# Patient Record
Sex: Male | Born: 1940
Health system: Southern US, Community
[De-identification: ages and names within clinical notes are randomized; demographics above are authoritative.]

## PROBLEM LIST (undated history)

## (undated) DIAGNOSIS — C4490 Unspecified malignant neoplasm of skin, unspecified: Secondary | ICD-10-CM

## (undated) DIAGNOSIS — A048 Other specified bacterial intestinal infections: Secondary | ICD-10-CM

## (undated) DIAGNOSIS — N529 Male erectile dysfunction, unspecified: Secondary | ICD-10-CM

## (undated) DIAGNOSIS — C61 Malignant neoplasm of prostate: Secondary | ICD-10-CM

## (undated) DIAGNOSIS — C439 Malignant melanoma of skin, unspecified: Secondary | ICD-10-CM

## (undated) DIAGNOSIS — F329 Major depressive disorder, single episode, unspecified: Secondary | ICD-10-CM

## (undated) DIAGNOSIS — R918 Other nonspecific abnormal finding of lung field: Secondary | ICD-10-CM

## (undated) DIAGNOSIS — K2270 Barrett's esophagus without dysplasia: Secondary | ICD-10-CM

## (undated) DIAGNOSIS — I10 Essential (primary) hypertension: Secondary | ICD-10-CM

## (undated) DIAGNOSIS — K635 Polyp of colon: Secondary | ICD-10-CM

## (undated) DIAGNOSIS — R1310 Dysphagia, unspecified: Secondary | ICD-10-CM

## (undated) DIAGNOSIS — F419 Anxiety disorder, unspecified: Secondary | ICD-10-CM

## (undated) DIAGNOSIS — F32A Depression, unspecified: Secondary | ICD-10-CM

## (undated) HISTORY — DX: Dysphagia, unspecified: R13.10

## (undated) HISTORY — DX: Malignant melanoma of skin, unspecified: C43.9

## (undated) HISTORY — DX: Depression, unspecified: F32.A

## (undated) HISTORY — DX: Other specified bacterial intestinal infections: A04.8

## (undated) HISTORY — DX: Other nonspecific abnormal finding of lung field: R91.8

## (undated) HISTORY — DX: Male erectile dysfunction, unspecified: N52.9

## (undated) HISTORY — PX: KNEE SURGERY: SHX244

## (undated) HISTORY — DX: Polyp of colon: K63.5

## (undated) HISTORY — DX: Barrett's esophagus without dysplasia: K22.70

## (undated) HISTORY — DX: Major depressive disorder, single episode, unspecified: F32.9

---

## 1995-09-24 HISTORY — PX: INSERTION PROSTATE RADIATION SEED: SUR718

## 2002-04-16 ENCOUNTER — Ambulatory Visit (HOSPITAL_COMMUNITY): Admission: RE | Admit: 2002-04-16 | Discharge: 2002-04-16 | Payer: Self-pay | Admitting: Gastroenterology

## 2002-11-25 ENCOUNTER — Encounter (INDEPENDENT_AMBULATORY_CARE_PROVIDER_SITE_OTHER): Payer: Self-pay | Admitting: Specialist

## 2002-11-25 ENCOUNTER — Ambulatory Visit (HOSPITAL_BASED_OUTPATIENT_CLINIC_OR_DEPARTMENT_OTHER): Admission: RE | Admit: 2002-11-25 | Discharge: 2002-11-25 | Payer: Self-pay | Admitting: Plastic Surgery

## 2004-03-11 ENCOUNTER — Emergency Department (HOSPITAL_COMMUNITY): Admission: EM | Admit: 2004-03-11 | Discharge: 2004-03-11 | Payer: Self-pay | Admitting: Emergency Medicine

## 2005-06-06 ENCOUNTER — Ambulatory Visit (HOSPITAL_BASED_OUTPATIENT_CLINIC_OR_DEPARTMENT_OTHER): Admission: RE | Admit: 2005-06-06 | Discharge: 2005-06-06 | Payer: Self-pay | Admitting: Plastic Surgery

## 2005-06-06 ENCOUNTER — Encounter (INDEPENDENT_AMBULATORY_CARE_PROVIDER_SITE_OTHER): Payer: Self-pay | Admitting: Specialist

## 2008-02-04 ENCOUNTER — Ambulatory Visit (HOSPITAL_COMMUNITY): Admission: RE | Admit: 2008-02-04 | Discharge: 2008-02-04 | Payer: Self-pay | Admitting: Gastroenterology

## 2008-12-07 ENCOUNTER — Emergency Department (HOSPITAL_COMMUNITY): Admission: EM | Admit: 2008-12-07 | Discharge: 2008-12-07 | Payer: Self-pay | Admitting: Emergency Medicine

## 2009-05-15 IMAGING — CR DG KNEE COMPLETE 4+V*R*
4 series · 4 of 4 positions shown · non-contrast
Comparison: None

CLINICAL DATA: Anterior knee pain status post fall yesterday.

RIGHT KNEE - COMPLETE 4+ VIEW

[view not recorded (1 of 4)]
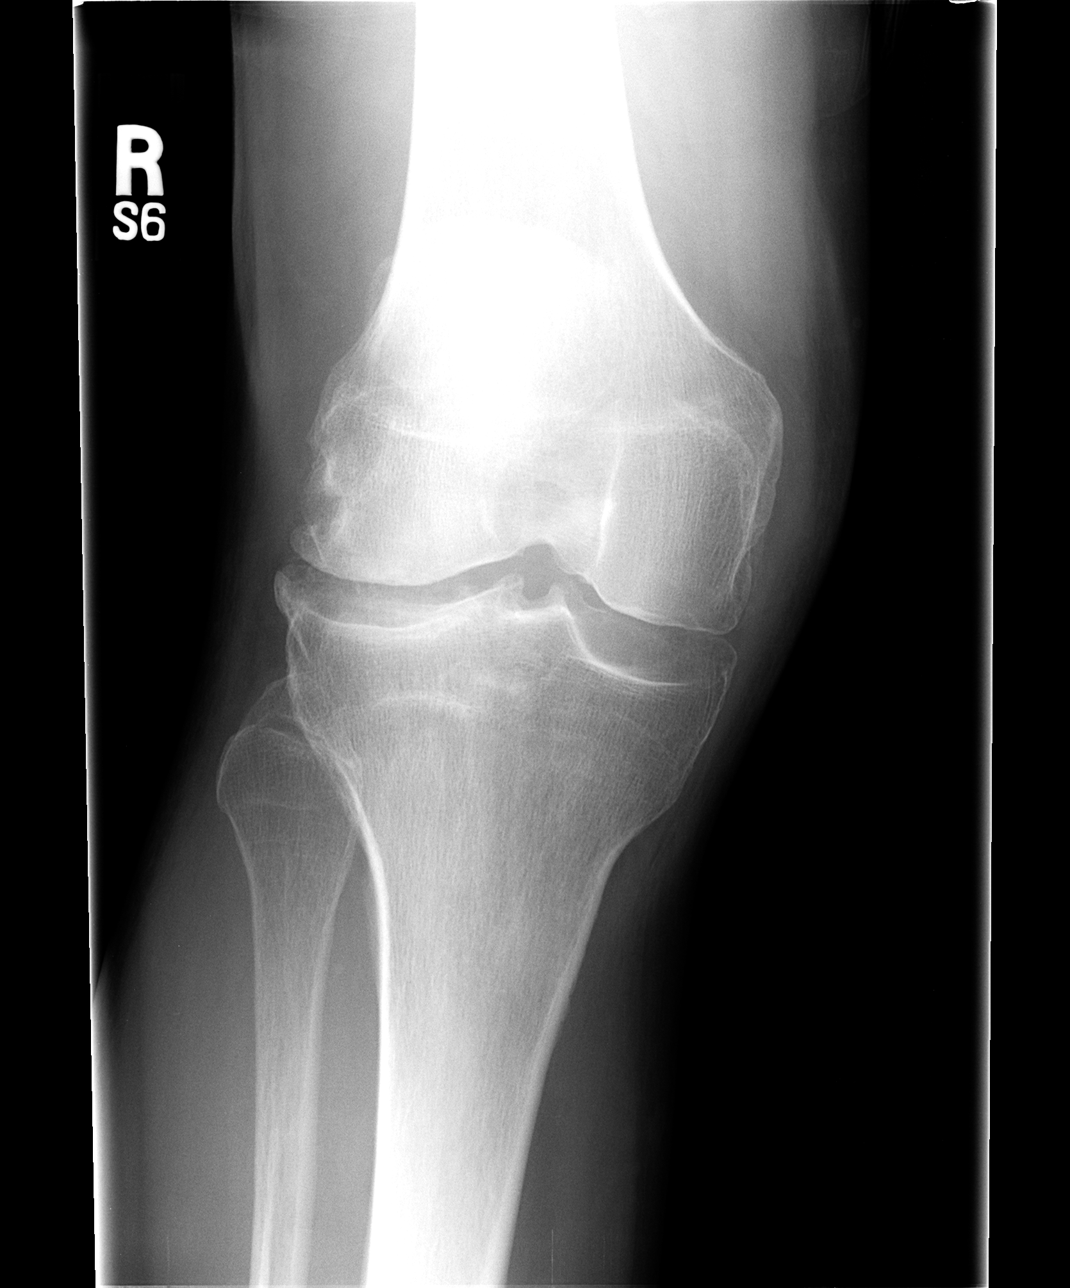

[view not recorded (2 of 4)]
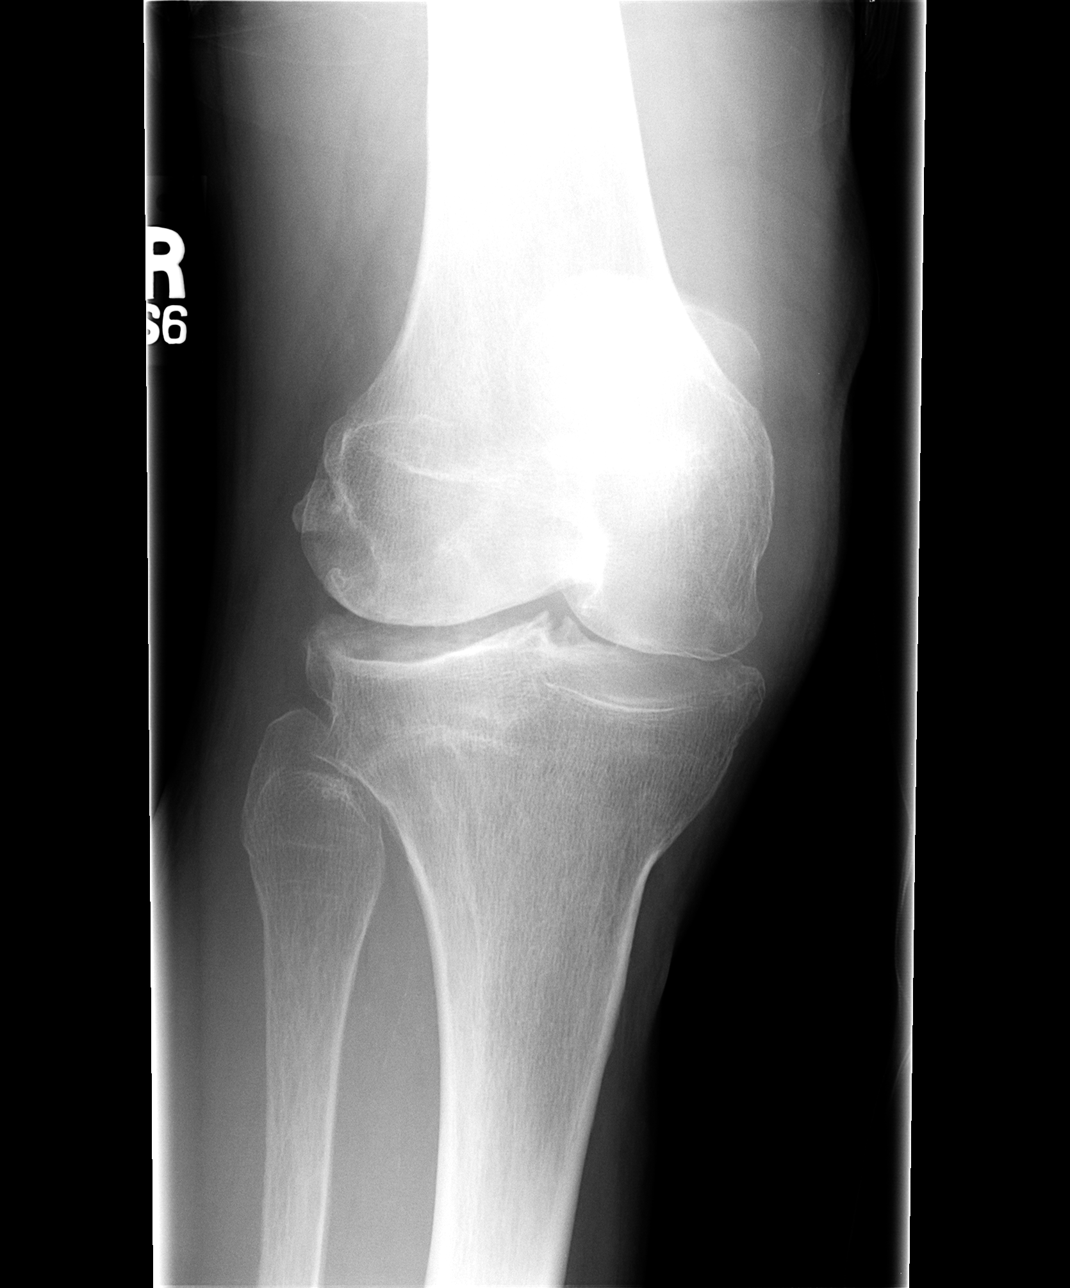

[view not recorded (3 of 4)]
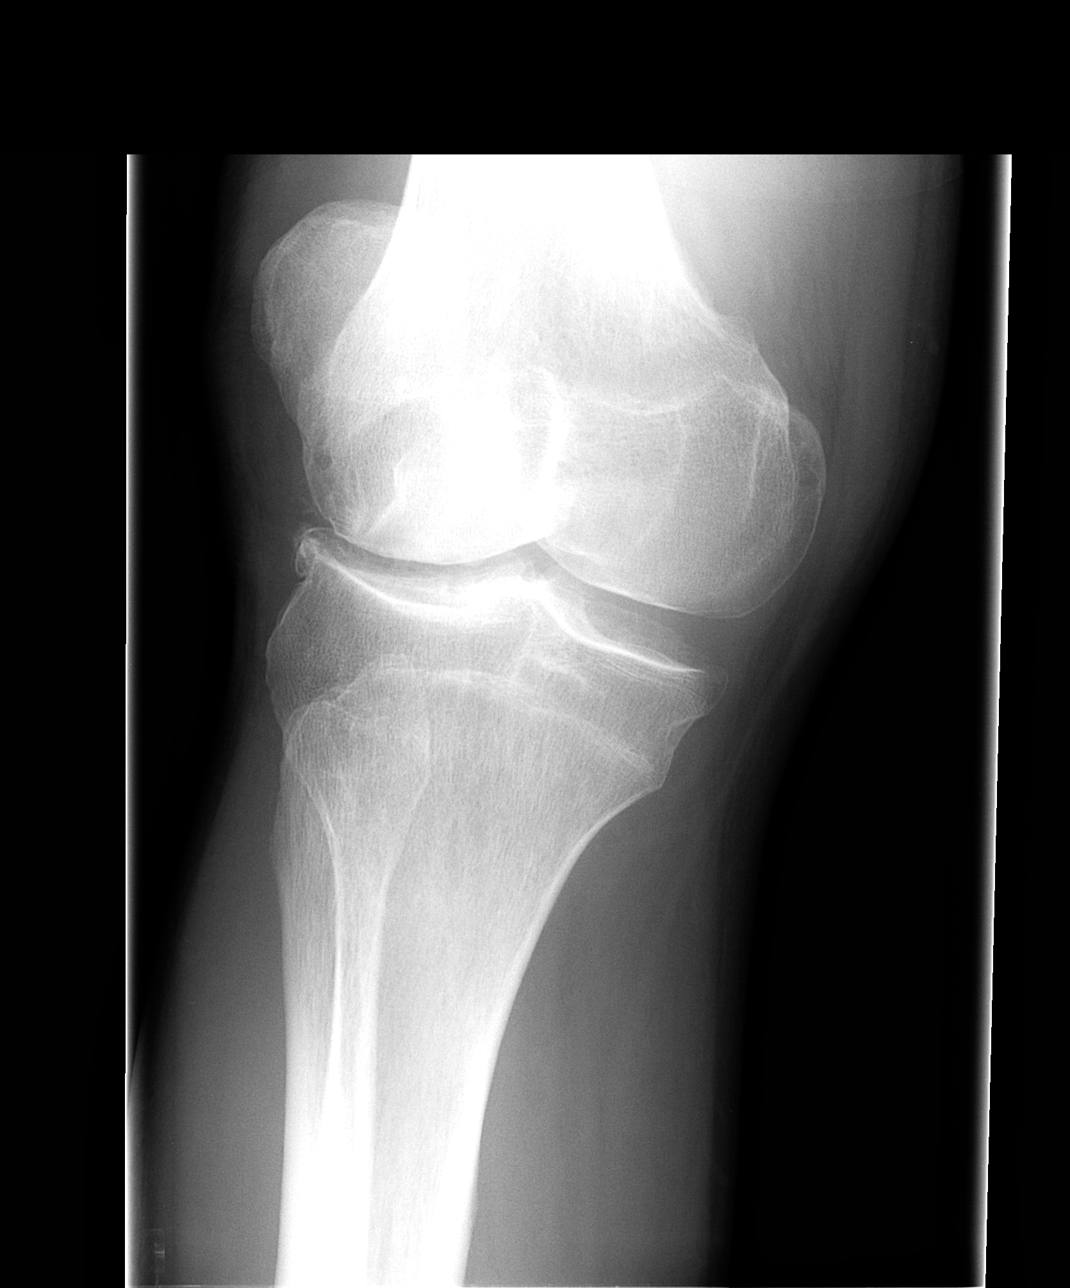

[view not recorded (4 of 4)]
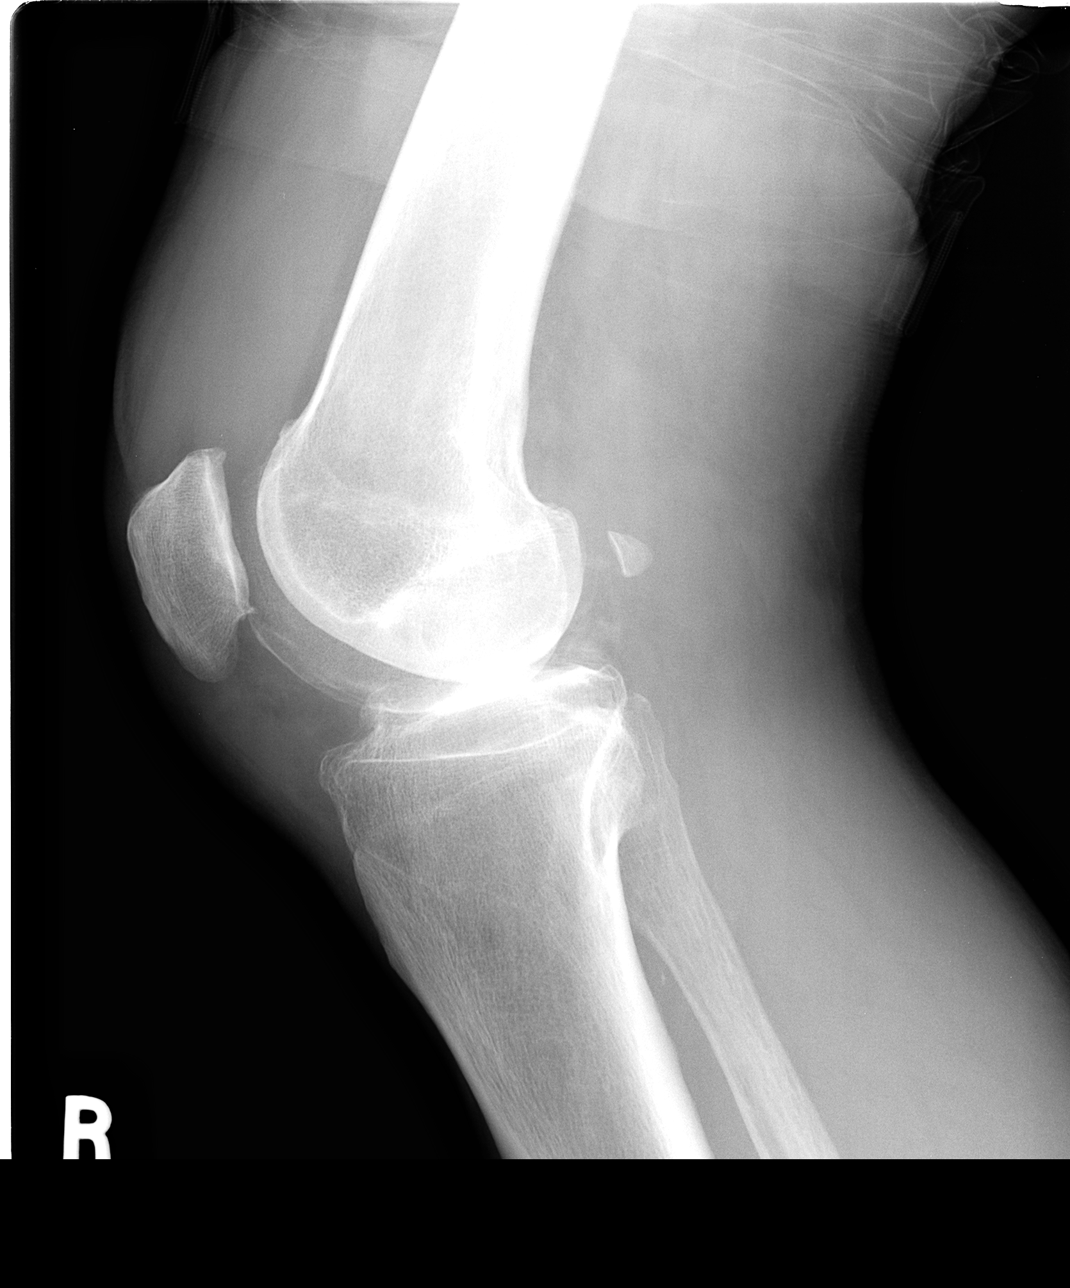

[4 of 4 positions shown; findings below may reference images not displayed]

FINDINGS: There is a large knee joint effusion with poor definition
of Hoffa's fat.  No acute fracture, dislocation or definite loose
body is identified.  There are tricompartmental degenerative
changes, most advanced laterally.
IMPRESSION: Large knee joint effusion and tricompartmental degenerative
changes.  No demonstrated acute osseous findings.

## 2011-02-05 NOTE — Op Note (Signed)
Douglas Fritz, Douglas Fritz                 ACCOUNT NO.:  [id number]   MEDICAL RECORD NO.:  [id number]          PATIENT TYPE:  AMB   LOCATION:  ENDO                         FACILITY:  [facility]   PHYSICIAN:  James L. Malon Kindle., M.D.DATE OF BIRTH:  03-26-1941   DATE OF PROCEDURE:  02/04/2008  DATE OF DISCHARGE:                               OPERATIVE REPORT   PROCEDURE:  Colonoscopy.   ENDOSCOPIST:  Llana Aliment. Edwards, M.D.   MEDICATIONS:  Fentanyl 75 mcg, Versed 7.5 mg IV.   SCOPE:  Pentax pediatric colonoscope.   INDICATIONS:  Previous history of adenomatous colon polyps.  This is  done as a routine followup.   DESCRIPTION OF PROCEDURE:  Procedure was explained to the patient and  consent obtained.  In the left lateral decubitus position, the pediatric  Pentax scope was inserted and advanced.  Prep was excellent.  Cecum was  reached without difficulty.  Ileocecal valve and appendiceal orifice  were seen.  Scope was withdrawn and the colon examined upon withdrawal.  Throughout the colon there was no significant diverticular disease, no  polyps, tumors or any other gross lesions.  The scope was withdrawn down  in the rectum and in the retroflex view, the patient was seen to have  very large internal hemorrhoids.  No other abnormalities were seen.   ASSESSMENT:  1. Large internal hemorrhoids.  2. History of colon polyps with a negative colon at this time.   PLAN:  We will recommend 5-year repeat colonoscopy.           ______________________________  Llana Aliment. Malon Kindle., M.D.     Waldron Session  D:  02/04/2008  T:  02/04/2008  Job:  [id number]   cc:   Oley Balm. Georgina Pillion, M.D.  Fax: [phone]

## 2011-02-08 NOTE — Procedures (Signed)
[place]  Patient:    Douglas Fritz, Douglas Fritz Visit Number: [id number] MRN: [id number]          Service Type: END Location: ENDO Attending Physician:  Orland Mustard Dictated by:   Llana Aliment. Randa Evens, M.D. Proc. Date: 04/16/02 Admit Date:  04/16/2002   CC:         Oley Balm. Georgina Pillion, M.D.   Procedure Report  PROCEDURE:  Colonoscopy.  MEDICATIONS:  Fentanyl 75 mcg, Versed 8 mg IV.  SCOPE:  Olympus pediatric video colonoscope.  INDICATIONS:  Previous history of adenomatous colon polyp. This patient was due for a three-year followup, but did keep that appointment; we are doing it at five years.  DESCRIPTION OF PROCEDURE:  The procedure had been explained to the patient; consent obtained.  With the patient in the left lateral decubitus position, the Olympus pediatric video colonoscope inserted, advanced under direct visualization. Prep excellent. We were able to reach the cecum without difficulty. The ileocecal valve and appendiceal orifice were seen. Scope withdrawn. Cecum, ascending colon, hepatic flexure, transverse colon, splenic flexure, descending, and sigmoid colon were seen well and were unremarkable. No polyps were seen throughout. The rectum was free of polyps. The scope withdrawn; patient tolerated the procedure well.  ASSESSMENT: 1. No evidence of further polyps. 2. Plan recommend repeating in five years. 3. Routine yearly Hemoccult during his physical exam. Dictated by:   Llana Aliment. Randa Evens, M.D. Attending Physician:  Orland Mustard DD:  04/16/02 TD:  04/19/02 Job: [id number]

## 2011-02-08 NOTE — Op Note (Signed)
   NAME:  Douglas Fritz, Douglas Fritz                             ACCOUNT NO.:  [id number]   MEDICAL RECORD NO.:  [id number]                   PATIENT TYPE:  AMB   LOCATION:  DSC                                  FACILITY:  [facility]   PHYSICIAN:  Consuello Bossier., M.D.         DATE OF BIRTH:  [birth year]   DATE OF PROCEDURE:  DATE OF DISCHARGE:                                 OPERATIVE REPORT   PREOPERATIVE DIAGNOSES:  Closure of complex wound of right upper back  following wide removal of large basal cell carcinoma, closure of complex  wound right dorsal forearm following wide local excision of basal cell  carcinoma and closure of right medial calf lesion following wide local  excision of basal cell carcinoma.   POSTOPERATIVE DIAGNOSES:  Closure of complex wound of right upper back  following wide removal of large basal cell carcinoma, closure of complex  wound right dorsal forearm following wide local excision of basal cell  carcinoma and closure of right medial calf lesion following wide local  excision of basal cell carcinoma.   SURGEON:  Pleas Patricia, M.D.   ANESTHESIA:  Xylocaine 2% with epinephrine 1:100,000.   FINDINGS:  The patient had large lesions, the one on the right posterior  shoulder area measuring just over 3 cm with resulting 5 1/2 cm complex wound  closure and the one on the right forearm lesion measuring 1 1/2 cm with a 3  cm complex closure and the one on the medial calf also 1 1/2 cm with a 3 cm  complex wound closure.   DESCRIPTION OF PROCEDURE:  The patient was brought to the operating room,  marked off for the planned repair following excision. The excision was  placed as much as possible in the wrinkle lines on the right back when not  being allowed to do this because of the shape of the lesion. He was prepped  with Betadine and draped sterilely. He was anesthetized with Xylocaine 2%  with epinephrine 1:100,000. Following the onset of anesthesia, an elliptical  excisional biopsies were performed. Bleeding was controlled with  electrocautery and there was noted to be good hemostasis. The wounds were  closed with interrupted subcutaneous 3-0 Monocryl followed by a running 4-0  Prolene obtaining the above length complex wound closures. Light compressive  dressings were applied. The patient tolerated the procedure well and was  discharged from the operating room subsequently to be followed by me as an  outpatient.                                               Consuello Bossier., M.D.    Tery Sanfilippo  D:  11/26/2002  T:  11/27/2002  Job:  [id number]

## 2011-07-22 ENCOUNTER — Other Ambulatory Visit: Payer: Self-pay | Admitting: Dermatology

## 2011-11-28 ENCOUNTER — Other Ambulatory Visit: Payer: Self-pay | Admitting: Dermatology

## 2012-06-04 ENCOUNTER — Other Ambulatory Visit: Payer: Self-pay | Admitting: Dermatology

## 2012-11-21 HISTORY — PX: MELANOMA EXCISION: SHX5266

## 2012-12-10 ENCOUNTER — Other Ambulatory Visit: Payer: Self-pay | Admitting: Dermatology

## 2012-12-16 ENCOUNTER — Other Ambulatory Visit: Payer: Self-pay | Admitting: Dermatology

## 2013-01-25 ENCOUNTER — Encounter (HOSPITAL_COMMUNITY): Payer: Self-pay | Admitting: Emergency Medicine

## 2013-01-25 ENCOUNTER — Emergency Department (HOSPITAL_COMMUNITY)
Admission: EM | Admit: 2013-01-25 | Discharge: 2013-01-25 | Disposition: A | Discharge status: Home / Self Care | Payer: Medicare Other | Attending: Emergency Medicine | Admitting: Emergency Medicine

## 2013-01-25 DIAGNOSIS — T63461A Toxic effect of venom of wasps, accidental (unintentional), initial encounter: Secondary | ICD-10-CM | POA: Insufficient documentation

## 2013-01-25 DIAGNOSIS — Z79899 Other long term (current) drug therapy: Secondary | ICD-10-CM | POA: Insufficient documentation

## 2013-01-25 DIAGNOSIS — Y939 Activity, unspecified: Secondary | ICD-10-CM | POA: Insufficient documentation

## 2013-01-25 DIAGNOSIS — I10 Essential (primary) hypertension: Secondary | ICD-10-CM | POA: Insufficient documentation

## 2013-01-25 DIAGNOSIS — T6391XA Toxic effect of contact with unspecified venomous animal, accidental (unintentional), initial encounter: Secondary | ICD-10-CM | POA: Insufficient documentation

## 2013-01-25 DIAGNOSIS — Z8546 Personal history of malignant neoplasm of prostate: Secondary | ICD-10-CM | POA: Insufficient documentation

## 2013-01-25 DIAGNOSIS — Y929 Unspecified place or not applicable: Secondary | ICD-10-CM | POA: Insufficient documentation

## 2013-01-25 DIAGNOSIS — Z7982 Long term (current) use of aspirin: Secondary | ICD-10-CM | POA: Insufficient documentation

## 2013-01-25 DIAGNOSIS — Z85828 Personal history of other malignant neoplasm of skin: Secondary | ICD-10-CM | POA: Insufficient documentation

## 2013-01-25 HISTORY — DX: Essential (primary) hypertension: I10

## 2013-01-25 HISTORY — DX: Unspecified malignant neoplasm of skin, unspecified: C44.90

## 2013-01-25 HISTORY — DX: Malignant neoplasm of prostate: C61

## 2013-01-25 MED ORDER — DIPHENHYDRAMINE HCL 50 MG/ML IJ SOLN
12.5000 mg | Freq: Once | INTRAMUSCULAR | Status: AC
  Administered 2013-01-25: 12.5 mg via INTRAVENOUS
  Filled 2013-01-25: qty 1

## 2013-01-25 MED ORDER — FAMOTIDINE IN NACL 20-0.9 MG/50ML-% IV SOLN
20.0000 mg | Freq: Once | INTRAVENOUS | Status: AC
  Administered 2013-01-25: 20 mg via INTRAVENOUS
  Filled 2013-01-25: qty 50

## 2013-01-25 MED ORDER — METHYLPREDNISOLONE SODIUM SUCC 125 MG IJ SOLR
125.0000 mg | Freq: Once | INTRAMUSCULAR | Status: AC
  Administered 2013-01-25: 125 mg via INTRAVENOUS
  Filled 2013-01-25: qty 2

## 2013-01-25 NOTE — ED Provider Notes (Signed)
History     CSN: [id number]  Arrival date & time 01/25/13  0625   First MD Initiated Contact with Patient 01/25/13 [phone]      Chief Complaint  Patient presents with  . Insect Bite    (Consider location/radiation/quality/duration/timing/severity/associated sxs/prior treatment) HPI Comments: He was stung by a hornet or wasp 20 minutes prior to arrival.  No rash. He came to the ED because he has had severe reaction to bee stings in the past. He reports feeling tightness in his throat but no lip or tongue swelling or difficulty breathing, but states he feels that the tightness may be "apprehension" rather than actual.  The history is provided by the patient.    Past Medical History  Diagnosis Date  . Prostate cancer   . Skin cancer   . Hypertension     Past Surgical History  Procedure Laterality Date  . Knee surgery      No family history on file.  History  Substance Use Topics  . Smoking status: Never Smoker   . Smokeless tobacco: Not on file  . Alcohol Use: 4.2 oz/week    7 Cans of beer per week      Review of Systems  Constitutional: Negative for fever.  HENT: Negative for sore throat, facial swelling and trouble swallowing.   Respiratory: Negative for choking and shortness of breath.   Cardiovascular: Negative for chest pain.  Gastrointestinal: Negative for nausea.  Skin: Negative for rash.  Neurological: Negative for light-headedness.    Allergies  Bee venom  Home Medications   Current Outpatient Rx  Name  Route  Sig  Dispense  Refill  . amLODipine (NORVASC) 5 MG tablet   Oral   Take 5 mg by mouth daily.         Marland Kitchen aspirin EC 81 MG tablet   Oral   Take 81 mg by mouth daily.           BP 156/93  Pulse 80  Temp(Src) 98.3 F (36.8 C) (Oral)  Resp 18  SpO2 99%  Physical Exam  Constitutional: He is oriented to person, place, and time. He appears well-developed and well-nourished.  HENT:  Head: Normocephalic.  Mouth/Throat: Oropharynx is  clear and moist.  Neck: Normal range of motion. Neck supple.  Cardiovascular: Normal rate and regular rhythm.   Pulmonary/Chest: Effort normal and breath sounds normal.  Abdominal: Soft. Bowel sounds are normal. There is no tenderness. There is no rebound and no guarding.  Musculoskeletal: Normal range of motion.  Neurological: He is alert and oriented to person, place, and time.  Skin: Skin is warm and dry. No rash noted.  Psychiatric: He has a normal mood and affect.    ED Course  Procedures (including critical care time)  Labs Reviewed - No data to display No results found.   No diagnosis found.  1. Wasp sting  MDM  He develops no symptoms of rash, swelling, SOB while under observation. Given IV medications, not Epi. He is feeling well and aymptomatic. Stable for discharge.  Discussed with Dr. Rulon Abide.        Arnoldo Hooker, PA-C 01/25/13 [phone]

## 2013-01-25 NOTE — ED Provider Notes (Signed)
Medical screening examination/treatment/procedure(s) were performed by non-physician practitioner and as supervising physician I was immediately available for consultation/collaboration.  John-Adam Melaysia Streed, M.D.     John-Adam Quinne Pires, MD 01/25/13 0745 

## 2013-01-25 NOTE — ED Notes (Addendum)
Pt reports he got stung by bee approx 15-20 mins ago on the finger. States last time he was stung by bee he got hives and slight swelling to throat. Pt reports he does feel some tightness in throat now. Pt is a&ox4. Denies itching or hives.

## 2013-02-09 ENCOUNTER — Encounter (HOSPITAL_COMMUNITY): Payer: Self-pay | Admitting: Emergency Medicine

## 2013-02-09 ENCOUNTER — Emergency Department (HOSPITAL_COMMUNITY): Payer: Medicare Other

## 2013-02-09 ENCOUNTER — Emergency Department (HOSPITAL_COMMUNITY)
Admission: EM | Admit: 2013-02-09 | Discharge: 2013-02-09 | Disposition: A | Discharge status: Home / Self Care | Payer: Medicare Other | Attending: Emergency Medicine | Admitting: Emergency Medicine

## 2013-02-09 DIAGNOSIS — R1013 Epigastric pain: Secondary | ICD-10-CM | POA: Insufficient documentation

## 2013-02-09 DIAGNOSIS — Z79899 Other long term (current) drug therapy: Secondary | ICD-10-CM | POA: Insufficient documentation

## 2013-02-09 DIAGNOSIS — F411 Generalized anxiety disorder: Secondary | ICD-10-CM | POA: Insufficient documentation

## 2013-02-09 DIAGNOSIS — F419 Anxiety disorder, unspecified: Secondary | ICD-10-CM

## 2013-02-09 DIAGNOSIS — Z7982 Long term (current) use of aspirin: Secondary | ICD-10-CM | POA: Insufficient documentation

## 2013-02-09 DIAGNOSIS — Z85828 Personal history of other malignant neoplasm of skin: Secondary | ICD-10-CM | POA: Insufficient documentation

## 2013-02-09 DIAGNOSIS — R6889 Other general symptoms and signs: Secondary | ICD-10-CM | POA: Insufficient documentation

## 2013-02-09 DIAGNOSIS — I10 Essential (primary) hypertension: Secondary | ICD-10-CM | POA: Insufficient documentation

## 2013-02-09 DIAGNOSIS — Z8546 Personal history of malignant neoplasm of prostate: Secondary | ICD-10-CM | POA: Insufficient documentation

## 2013-02-09 LAB — CBC
MCHC: 36.1 g/dL — ABNORMAL HIGH (ref 30.0–36.0)
MCV: 90.7 fL (ref 78.0–100.0)
WBC: 10 10*3/uL (ref 4.0–10.5)

## 2013-02-09 LAB — BASIC METABOLIC PANEL
Chloride: 95 mEq/L — ABNORMAL LOW (ref 96–112)
Creatinine, Ser: 0.83 mg/dL (ref 0.50–1.35)
GFR calc Af Amer: >90 mL/min (ref >90)
Glucose, Bld: 123 mg/dL — ABNORMAL HIGH (ref 70–99)
Potassium: 3.7 mEq/L (ref 3.5–5.1)

## 2013-02-09 LAB — POCT I-STAT TROPONIN I: Troponin i, poc: 0 ng/mL (ref 0.00–0.08)

## 2013-02-09 MED ORDER — LORAZEPAM 1 MG PO TABS: 1.0000 mg | ORAL_TABLET | Freq: Three times a day (TID) | ORAL | Status: DC | PRN

## 2013-02-09 MED ORDER — LORAZEPAM 2 MG/ML IJ SOLN
1.0000 mg | Freq: Once | INTRAMUSCULAR | Status: AC
  Administered 2013-02-09: 1 mg via INTRAVENOUS
  Filled 2013-02-09: qty 1

## 2013-02-09 NOTE — ED Provider Notes (Signed)
History     CSN: [id number]  Arrival date & time 02/09/13  1831   First MD Initiated Contact with Patient 02/09/13 1952      Chief Complaint  Patient presents with  . Chest Pain    (Consider location/radiation/quality/duration/timing/severity/associated sxs/prior treatment) Patient is a 72 y.o. male presenting with chest pain. The history is provided by the patient.  Chest Pain He noted onset this morning of a sense of choking and AF date data chest pain which seemed to start in the epigastric area and go up into his chest. He has difficulty putting a number on it. He felt like he was having difficulty swallowing. There is no associated dyspnea, nausea, diaphoresis. He he states he's been under a lot of stress recently and thinks that that may have been contributing to it. Nothing seemed to make the symptoms better nothing made his symptoms worse. Has had generally resolved at this point although he is still feeling a little anxious. Cardiac risk factors include hypertension but he denies history of diabetes, hyperlipidemia, smoking.  Past Medical History  Diagnosis Date  . Prostate cancer   . Skin cancer   . Hypertension     Past Surgical History  Procedure Laterality Date  . Knee surgery      History reviewed. No pertinent family history.  History  Substance Use Topics  . Smoking status: Never Smoker   . Smokeless tobacco: Not on file  . Alcohol Use: 4.2 oz/week    7 Cans of beer per week      Review of Systems  Cardiovascular: Positive for chest pain.  All other systems reviewed and are negative.    Allergies  Bee venom  Home Medications   Current Outpatient Rx  Name  Route  Sig  Dispense  Refill  . amLODipine (NORVASC) 5 MG tablet   Oral   Take 5 mg by mouth daily.         Marland Kitchen aspirin EC 81 MG tablet   Oral   Take 81 mg by mouth daily.         . calcium carbonate (TUMS - DOSED IN MG ELEMENTAL CALCIUM) 500 MG chewable tablet   Oral   Chew 1  tablet by mouth daily as needed for heartburn.           BP 177/98  Pulse 95  Temp(Src) 97.5 F (36.4 C) (Oral)  Resp 18  SpO2 100%  Physical Exam  Nursing note and vitals reviewed.  72 year old male, resting comfortably and in no acute distress. Vital signs are significant for hypertension with blood pressure 188/87. Oxygen saturation is 98%, which is normal. Head is normocephalic and atraumatic. PERRLA, EOMI. Oropharynx is clear. Neck is nontender and supple without adenopathy or JVD. Back is nontender and there is no CVA tenderness. Lungs are clear without rales, wheezes, or rhonchi. Chest is nontender. Heart has regular rate and rhythm without murmur. Abdomen is soft, flat, nontender without masses or hepatosplenomegaly and peristalsis is normoactive. Extremities have no cyanosis or edema, full range of motion is present. Skin is warm and dry without rash. Neurologic: Mental status is normal, cranial nerves are intact, there are no motor or sensory deficits.  ED Course  Procedures (including critical care time)  Results for orders placed during the hospital encounter of 02/09/13  CBC      Result Value Range   WBC 10.0  4.0 - 10.5 K/uL   RBC 4.83  4.22 - 5.81 MIL/uL  Hemoglobin 15.8  13.0 - 17.0 g/dL   HCT 16.1  09.6 - 04.5 %   MCV 90.7  78.0 - 100.0 fL   MCH 32.7  26.0 - 34.0 pg   MCHC 36.1 (*) 30.0 - 36.0 g/dL   RDW 40.9  81.1 - 91.4 %   Platelets 333  150 - 400 K/uL  BASIC METABOLIC PANEL      Result Value Range   Sodium 132 (*) 135 - 145 mEq/L   Potassium 3.7  3.5 - 5.1 mEq/L   Chloride 95 (*) 96 - 112 mEq/L   CO2 22  19 - 32 mEq/L   Glucose, Bld 123 (*) 70 - 99 mg/dL   BUN 12  6 - 23 mg/dL   Creatinine, Ser 7.82  0.50 - 1.35 mg/dL   Calcium 9.8  8.4 - 95.6 mg/dL   GFR calc non Af Amer 86 (*) >90 mL/min   GFR calc Af Amer >90  >90 mL/min  POCT I-STAT TROPONIN I      Result Value Range   Troponin i, poc 0.00  0.00 - 0.08 ng/mL   Comment 3             Dg Chest 2 View  02/09/2013   *RADIOLOGY REPORT*  Clinical Data: Difficulty swallowing.  Choking sensation.  Prior history of prostate cancer.  CHEST - 2 VIEW  Comparison: None.  Findings: Cardiomediastinal silhouette unremarkable.   Lungs clear. Bronchovascular markings normal.  Pulmonary vascularity normal.  No pneumothorax.  No pleural effusions.  Mild pectus excavatum sternal deformity.  Degenerative changes involving the thoracic spine.  IMPRESSION: No acute cardiopulmonary disease.   Original Report Authenticated By: Hulan Saas, M.D.     Date: 02/09/2013  Rate: 107  Rhythm: sinus tachycardia and premature ventricular contractions (PVC)  QRS Axis: left  Intervals: normal  ST/T Wave abnormalities: normal  Conduction Disutrbances:none  Narrative Interpretation: Left axis deviation, right atrial hypertrophy, RSR' in V1 suggesting right ventricular conduction delay. No prior ECG available for comparison.  Old EKG Reviewed: none available    1. Anxiety       MDM  Symptom complex which isn't far more compatible with anxiety than coronary artery disease. His workup is negative including ECG and troponin. Mild hyponatremia is noted as well as mild hyperglycemia. These can be followed as an outpatient. He will be given a therapeutic trial of lorazepam.  He feels much better after a dose of lorazepam. He is discharged with a prescription for lorazepam.     Dione Booze, MD 02/09/13 2125

## 2013-02-09 NOTE — ED Notes (Signed)
Pt c/o epigastric and CP he describes as a fullness and a "choking sensation" starting today; pt sts increased recent stress; pt denies SOB

## 2013-03-17 ENCOUNTER — Other Ambulatory Visit: Payer: Self-pay | Admitting: Dermatology

## 2013-07-14 ENCOUNTER — Emergency Department (HOSPITAL_COMMUNITY)
Admission: EM | Admit: 2013-07-14 | Discharge: 2013-07-14 | Disposition: A | Discharge status: Home / Self Care | Payer: Medicare Other | Attending: Emergency Medicine | Admitting: Emergency Medicine

## 2013-07-14 ENCOUNTER — Encounter (HOSPITAL_COMMUNITY): Payer: Self-pay | Admitting: Emergency Medicine

## 2013-07-14 DIAGNOSIS — I10 Essential (primary) hypertension: Secondary | ICD-10-CM | POA: Insufficient documentation

## 2013-07-14 DIAGNOSIS — Z79899 Other long term (current) drug therapy: Secondary | ICD-10-CM | POA: Insufficient documentation

## 2013-07-14 DIAGNOSIS — F419 Anxiety disorder, unspecified: Secondary | ICD-10-CM

## 2013-07-14 DIAGNOSIS — F411 Generalized anxiety disorder: Secondary | ICD-10-CM | POA: Insufficient documentation

## 2013-07-14 DIAGNOSIS — Z8546 Personal history of malignant neoplasm of prostate: Secondary | ICD-10-CM | POA: Insufficient documentation

## 2013-07-14 DIAGNOSIS — R110 Nausea: Secondary | ICD-10-CM | POA: Insufficient documentation

## 2013-07-14 DIAGNOSIS — C439 Malignant melanoma of skin, unspecified: Secondary | ICD-10-CM | POA: Insufficient documentation

## 2013-07-14 DIAGNOSIS — Z7982 Long term (current) use of aspirin: Secondary | ICD-10-CM | POA: Insufficient documentation

## 2013-07-14 MED ORDER — LORAZEPAM 1 MG PO TABS
1.0000 mg | ORAL_TABLET | Freq: Once | ORAL | Status: AC
  Administered 2013-07-14: 1 mg via ORAL
  Filled 2013-07-14: qty 1

## 2013-07-14 MED ORDER — LORAZEPAM 1 MG PO TABS: 1.0000 mg | ORAL_TABLET | Freq: Three times a day (TID) | ORAL | Status: DC | PRN

## 2013-07-14 NOTE — ED Notes (Signed)
Pt reports that he is being treated by a psychiatrist for newly diagnosed panic attacks and his anxiety is not getting any better. States "they are trying a lot of things but nothing seems to be helping." he just recently started taking prozac. Reports today he feels tension all over his body and his stomach is upset. Pt denies any thoughts of harming himself or others. A&ox4, resp e/u

## 2013-07-14 NOTE — ED Provider Notes (Signed)
CSN: [id number]     Arrival date & time 07/14/13  1704 History   First MD Initiated Contact with Patient 07/14/13 1737     Chief Complaint  Patient presents with  . Anxiety   (Consider location/radiation/quality/duration/timing/severity/associated sxs/prior Treatment) HPI Comments: 72 y/o male with recent diagnosis of anxiety presenting with increase in anxiety. He reports symptoms began after having a biopsy of a skin lesion that was found to be melanoma. Since that time, he states that he feels like something is out to get him. He reports generalized tension as well as nausea during these times. They have been relieved by ativan and exercise in the past. No new stressors. He began seeing a psychiatrist who initiated prozac one week ago. He denies chest pain, SOB, diaphoresis, abdominal pain, n/v, syncope.   The history is provided by the patient. No language interpreter was used.    Past Medical History  Diagnosis Date  . Prostate cancer   . Skin cancer   . Hypertension    Past Surgical History  Procedure Laterality Date  . Knee surgery     History reviewed. No pertinent family history. History  Substance Use Topics  . Smoking status: Never Smoker   . Smokeless tobacco: Not on file  . Alcohol Use: 4.2 oz/week    7 Cans of beer per week    Review of Systems  Constitutional: Negative for fever, chills, diaphoresis and fatigue.  Respiratory: Negative for cough, chest tightness and shortness of breath.   Cardiovascular: Negative for chest pain, palpitations and leg swelling.  Gastrointestinal: Positive for nausea. Negative for vomiting, abdominal pain, diarrhea, blood in stool and abdominal distention.  Neurological: Negative for dizziness, syncope, weakness and light-headedness.  All other systems reviewed and are negative.    Allergies  Bee venom  Home Medications   Current Outpatient Rx  Name  Route  Sig  Dispense  Refill  . amLODipine (NORVASC) 5 MG tablet    Oral   Take 5 mg by mouth daily.         Marland Kitchen aspirin EC 81 MG tablet   Oral   Take 81 mg by mouth daily.         . calcium carbonate (TUMS - DOSED IN MG ELEMENTAL CALCIUM) 500 MG chewable tablet   Oral   Chew 1 tablet by mouth daily as needed for heartburn.         Marland Kitchen FLUoxetine (PROZAC) 10 MG tablet   Oral   Take 10 mg by mouth daily.         Marland Kitchen LORazepam (ATIVAN) 1 MG tablet   Oral   Take 1 tablet (1 mg total) by mouth 3 (three) times daily as needed for anxiety.   15 tablet   0    BP 164/103  Pulse 99  Temp(Src) 98.1 F (36.7 C) (Oral)  Resp 16  Ht 5\' 8"  (1.727 m)  Wt 150 lb (68.04 kg)  BMI 22.81 kg/m2  SpO2 96% Physical Exam  Vitals reviewed. Constitutional: He is oriented to person, place, and time. He appears well-developed and well-nourished. No distress.  HENT:  Head: Normocephalic.  Mouth/Throat: Oropharynx is clear and moist.  Eyes: Conjunctivae are normal.  Neck: No thyromegaly present.  Cardiovascular: Normal rate, regular rhythm and normal heart sounds.   Pulmonary/Chest: Effort normal and breath sounds normal.  Abdominal: Soft. Bowel sounds are normal. He exhibits no distension. There is no tenderness.  Musculoskeletal: He exhibits no edema and no tenderness.  Neurological:  He is alert and oriented to person, place, and time.  Skin: Skin is warm and dry.  Psychiatric: He has a normal mood and affect. His speech is normal and behavior is normal. Judgment and thought content normal. Cognition and memory are normal.    ED Course  Procedures (including critical care time) Labs Review Labs Reviewed - No data to display Imaging Review No results found.  EKG Interpretation   None       MDM   1. Anxiety     72 y/o male with anxiety. Began prozac one week ago. Reporting persistent symptoms of diffuse tension and feeling of doom. Denies SI/HI/AVH. Trial of ativan 1 mg helped on last visit. Denies chest pain, sob, cough, weakness, fever.  Anxiety associated with intermittent nausea but no vomiting, abdominal pain, diarrhea. Discussed delay in onset of SSRIs. As he had improvement in symptoms with ativan, will increase dose from 0.5 mg to 1 mg. He will continue taking prozac and f/u with psychiatrist next week. Discussed with patient who is in agreement. Return precautions reviewed and he voiced understanding of instructions.   Labs and imaging reviewed in my medical decision making if ordered. Patient discussed with my attending, Dr. Micheline Maze.      Abagail Kitchens, MD 07/15/13 2109

## 2013-07-18 NOTE — ED Provider Notes (Signed)
Medical screening examination/treatment/procedure(s) were conducted as a shared visit with resident-physician practitioner(s) and myself.  I personally evaluated the patient during the encounter.  Pt is a 72 y.o. male with pmhx as above presenting with anxiety, hx of same.  Pt reporting tension, feeling down. No SI/HI/AVH.  VSS, pt in NAD, benign cardiopulm exam..  Denies CP, SOB, infectios s/sx.    Pt found to have improvement of symptoms w/ dose of Ativan. He recently started SSRI, will not make further acute med changes. He can f/u with his mental health provider as outpt.Shanna Cisco, MD 07/18/13 2035

## 2013-07-27 ENCOUNTER — Other Ambulatory Visit: Payer: Self-pay | Admitting: Family Medicine

## 2013-07-27 DIAGNOSIS — R109 Unspecified abdominal pain: Secondary | ICD-10-CM

## 2013-07-29 ENCOUNTER — Other Ambulatory Visit: Payer: Self-pay

## 2013-07-30 ENCOUNTER — Emergency Department (HOSPITAL_COMMUNITY): Payer: Medicare Other

## 2013-07-30 ENCOUNTER — Emergency Department (HOSPITAL_COMMUNITY)
Admission: EM | Admit: 2013-07-30 | Discharge: 2013-07-30 | Disposition: A | Discharge status: Home / Self Care | Payer: Medicare Other | Attending: Emergency Medicine | Admitting: Emergency Medicine

## 2013-07-30 ENCOUNTER — Encounter (HOSPITAL_COMMUNITY): Payer: Self-pay | Admitting: Emergency Medicine

## 2013-07-30 DIAGNOSIS — Z85828 Personal history of other malignant neoplasm of skin: Secondary | ICD-10-CM | POA: Insufficient documentation

## 2013-07-30 DIAGNOSIS — I10 Essential (primary) hypertension: Secondary | ICD-10-CM | POA: Insufficient documentation

## 2013-07-30 DIAGNOSIS — R911 Solitary pulmonary nodule: Secondary | ICD-10-CM

## 2013-07-30 DIAGNOSIS — Z7982 Long term (current) use of aspirin: Secondary | ICD-10-CM | POA: Insufficient documentation

## 2013-07-30 DIAGNOSIS — M545 Low back pain, unspecified: Secondary | ICD-10-CM

## 2013-07-30 DIAGNOSIS — R634 Abnormal weight loss: Secondary | ICD-10-CM

## 2013-07-30 DIAGNOSIS — R110 Nausea: Secondary | ICD-10-CM

## 2013-07-30 DIAGNOSIS — F419 Anxiety disorder, unspecified: Secondary | ICD-10-CM

## 2013-07-30 DIAGNOSIS — Z8546 Personal history of malignant neoplasm of prostate: Secondary | ICD-10-CM | POA: Insufficient documentation

## 2013-07-30 DIAGNOSIS — F411 Generalized anxiety disorder: Secondary | ICD-10-CM | POA: Insufficient documentation

## 2013-07-30 DIAGNOSIS — Z79899 Other long term (current) drug therapy: Secondary | ICD-10-CM | POA: Insufficient documentation

## 2013-07-30 HISTORY — DX: Anxiety disorder, unspecified: F41.9

## 2013-07-30 LAB — URINALYSIS W MICROSCOPIC + REFLEX CULTURE
Bilirubin Urine: NEGATIVE
Glucose, UA: NEGATIVE mg/dL
Hgb urine dipstick: NEGATIVE
Ketones, ur: 15 mg/dL — AB
Leukocytes, UA: NEGATIVE
Nitrite: NEGATIVE
Protein, ur: NEGATIVE mg/dL
Urobilinogen, UA: 1 mg/dL (ref 0.0–1.0)
pH: 7 (ref 5.0–8.0)

## 2013-07-30 LAB — CBC WITH DIFFERENTIAL/PLATELET
Basophils Absolute: 0 10*3/uL (ref 0.0–0.1)
Eosinophils Absolute: 0 10*3/uL (ref 0.0–0.7)
HCT: 42.3 % (ref 39.0–52.0)
Lymphocytes Relative: 20 % (ref 12–46)
Lymphs Abs: 1.5 10*3/uL (ref 0.7–4.0)
MCH: 33.3 pg (ref 26.0–34.0)
MCHC: 36.9 g/dL — ABNORMAL HIGH (ref 30.0–36.0)
Monocytes Absolute: 0.6 10*3/uL (ref 0.1–1.0)
Monocytes Relative: 8 % (ref 3–12)
Neutrophils Relative %: 73 % (ref 43–77)
RBC: 4.68 MIL/uL (ref 4.22–5.81)
WBC: 7.6 10*3/uL (ref 4.0–10.5)

## 2013-07-30 LAB — COMPREHENSIVE METABOLIC PANEL
AST: 19 U/L (ref 0–37)
BUN: 11 mg/dL (ref 6–23)
CO2: 22 mEq/L (ref 19–32)
Calcium: 9.1 mg/dL (ref 8.4–10.5)
Creatinine, Ser: 0.75 mg/dL (ref 0.50–1.35)
GFR calc Af Amer: >90 mL/min (ref >90)
GFR calc non Af Amer: 89 mL/min — ABNORMAL LOW (ref >90)
Glucose, Bld: 107 mg/dL — ABNORMAL HIGH (ref 70–99)
Total Bilirubin: 0.5 mg/dL (ref 0.3–1.2)
Total Protein: 7 g/dL (ref 6.0–8.3)

## 2013-07-30 LAB — LIPASE, BLOOD: Lipase: 21 U/L (ref 11–59)

## 2013-07-30 MED ORDER — ONDANSETRON 4 MG PO TBDP
8.0000 mg | ORAL_TABLET | Freq: Once | ORAL | Status: AC
  Administered 2013-07-30: 8 mg via ORAL
  Filled 2013-07-30: qty 2

## 2013-07-30 MED ORDER — IOHEXOL 300 MG/ML  SOLN: 25.0000 mL | INTRAMUSCULAR | Status: AC

## 2013-07-30 MED ORDER — SODIUM CHLORIDE 0.9 % IV SOLN
INTRAVENOUS | Status: DC
  Administered 2013-07-30: 17:00:00 via INTRAVENOUS

## 2013-07-30 MED ORDER — IOHEXOL 300 MG/ML  SOLN
100.0000 mL | Freq: Once | INTRAMUSCULAR | Status: AC | PRN
  Administered 2013-07-30: 100 mL via INTRAVENOUS

## 2013-07-30 MED ORDER — ONDANSETRON HCL 4 MG PO TABS: 4.0000 mg | ORAL_TABLET | Freq: Three times a day (TID) | ORAL | Status: DC | PRN

## 2013-07-30 NOTE — ED Notes (Signed)
Patient finished contrast, CT notified. 

## 2013-07-30 NOTE — ED Notes (Signed)
Pt placed back on monitor, continuous pulse oximetry and blood pressure cuff 

## 2013-07-30 NOTE — ED Provider Notes (Signed)
CSN: [id number]     Arrival date & time 07/30/13  1315 History   First MD Initiated Contact with Patient 07/30/13 1350     Chief Complaint  Patient presents with  . Nausea    HPI Pt was seen at 1410. Per pt, c/o gradual onset and persistence of constant nausea for the past 2 weeks. Pt states he "just doesn't want to eat" and has "lost 10lbs in the past 10 days." States his PMD referred him to GI MD for same, and he has an appt on Monday. States he "just can't wait until Monday" and "think I have pancreatic cancer." Pt endorses hx of significant anxiety with several ED and PMD visits for same, and states he only intermittently has been taking his ativan and Prozac rx by his PMD for his anxiety. Denies vomiting/diarrhea, no abd pain, no CP/palpitations, no SOB/cough. Pt also c/o gradual onset and persistence of constant low back "pain" for the past 2 weeks. Pain worsens with palpation of the area and body position changes. Denies incont/retention of bowel or bladder, no saddle anesthesia, no focal motor weakness, no tingling/numbness in extremities, no fevers, no injury.    Past Medical History  Diagnosis Date  . Prostate cancer   . Skin cancer   . Hypertension   . Anxiety    Past Surgical History  Procedure Laterality Date  . Knee surgery      History  Substance Use Topics  . Smoking status: Never Smoker   . Smokeless tobacco: Not on file  . Alcohol Use: 4.2 oz/week    7 Cans of beer per week    Review of Systems ROS: Statement: All systems negative except as marked or noted in the HPI; Constitutional: Negative for fever and chills. +weight loss.; ; Eyes: Negative for eye pain, redness and discharge. ; ; ENMT: Negative for ear pain, hoarseness, nasal congestion, sinus pressure and sore throat. ; ; Cardiovascular: Negative for chest pain, palpitations, diaphoresis, dyspnea and peripheral edema. ; ; Respiratory: Negative for cough, wheezing and stridor. ; ; Gastrointestinal: +nausea,  anorexia. Negative for vomiting, diarrhea, abdominal pain, blood in stool, hematemesis, jaundice and rectal bleeding. . ; ; Genitourinary: Negative for dysuria, flank pain and hematuria. ; ; Musculoskeletal: +LBP. Negative for neck pain. Negative for swelling and trauma.; ; Skin: Negative for pruritus, rash, abrasions, blisters, bruising and skin lesion.; ; Neuro: Negative for headache, lightheadedness and neck stiffness. Negative for weakness, altered level of consciousness , altered mental status, extremity weakness, paresthesias, involuntary movement, seizure and syncope.; Psych:  +anxiety. No SI, no SA, no HI, no hallucinations.      Allergies  Bee venom  Home Medications   Current Outpatient Rx  Name  Route  Sig  Dispense  Refill  . amLODipine (NORVASC) 5 MG tablet   Oral   Take 5 mg by mouth daily.         Marland Kitchen aspirin EC 81 MG tablet   Oral   Take 81 mg by mouth daily.         . calcium carbonate (TUMS - DOSED IN MG ELEMENTAL CALCIUM) 500 MG chewable tablet   Oral   Chew 1 tablet by mouth daily as needed for heartburn.         Marland Kitchen FLUoxetine (PROZAC) 10 MG tablet   Oral   Take 10 mg by mouth daily.         Marland Kitchen LORazepam (ATIVAN) 1 MG tablet   Oral   Take 1 tablet (  1 mg total) by mouth 3 (three) times daily as needed for anxiety.   15 tablet   0   . LORazepam (ATIVAN) 1 MG tablet   Oral   Take 1 tablet (1 mg total) by mouth 3 (three) times daily as needed for anxiety.   15 tablet   0    BP 158/95  Pulse 91  Temp(Src) 97.7 F (36.5 C) (Oral)  Resp 18  Ht 5\' 8"  (1.727 m)  Wt 150 lb (68.04 kg)  BMI 22.81 kg/m2  SpO2 98% Physical Exam 1415: Physical examination:  Nursing notes reviewed; Vital signs and O2 SAT reviewed;  Constitutional: Well developed, Well nourished, Well hydrated, In no acute distress; Head:  Normocephalic, atraumatic; Eyes: EOMI, PERRL, No scleral icterus; ENMT: Mouth and pharynx normal, Mucous membranes moist; Neck: Supple, Full range of  motion, No lymphadenopathy; Cardiovascular: Regular rate and rhythm, No gallop; Respiratory: Breath sounds clear & equal bilaterally, No wheezes.  Speaking full sentences with ease, Normal respiratory effort/excursion; Chest: Nontender, Movement normal; Abdomen: Soft, Nontender, Nondistended, Normal bowel sounds; Genitourinary: No CVA tenderness; Spine:  No midline CS, TS, LS tenderness. +TTP bilat lumbar paraspinal muscles;;  Extremities: Pulses normal, No tenderness, No edema, No calf edema or asymmetry.; Neuro: AA&Ox3, Major CN grossly intact.  Speech clear. Climbs on and off stretcher easily by himself. Gait steady. No gross focal motor or sensory deficits in extremities.; Skin: Color normal, Warm, Dry.; Psych:  Poor eye contact, anxious. Denies SI.     ED Course  Procedures   EKG Interpretation     Ventricular Rate:  78 PR Interval:  194 QRS Duration: 93 QT Interval:  383 QTC Calculation: 436 R Axis:   -77 Text Interpretation:  Sinus rhythm Left axis deviation Anteroseptal infarct, age indeterminate Baseline wander in lead(s) V2 V3 No significant change since last tracing 02/09/2013            MDM  MDM Reviewed: previous chart, nursing note and vitals Reviewed previous: labs, ECG and x-ray Interpretation: labs, ECG, x-ray and CT scan       Results for orders placed during the hospital encounter of 07/30/13  CBC WITH DIFFERENTIAL      Result Value Range   WBC 7.6  4.0 - 10.5 K/uL   RBC 4.68  4.22 - 5.81 MIL/uL   Hemoglobin 15.6  13.0 - 17.0 g/dL   HCT 09.8  11.9 - 14.7 %   MCV 90.4  78.0 - 100.0 fL   MCH 33.3  26.0 - 34.0 pg   MCHC 36.9 (*) 30.0 - 36.0 g/dL   RDW 82.9  56.2 - 13.0 %   Platelets 295  150 - 400 K/uL   Neutrophils Relative % 73  43 - 77 %   Neutro Abs 5.5  1.7 - 7.7 K/uL   Lymphocytes Relative 20  12 - 46 %   Lymphs Abs 1.5  0.7 - 4.0 K/uL   Monocytes Relative 8  3 - 12 %   Monocytes Absolute 0.6  0.1 - 1.0 K/uL   Eosinophils Relative 0  0 - 5 %    Eosinophils Absolute 0.0  0.0 - 0.7 K/uL   Basophils Relative 0  0 - 1 %   Basophils Absolute 0.0  0.0 - 0.1 K/uL  COMPREHENSIVE METABOLIC PANEL      Result Value Range   Sodium 131 (*) 135 - 145 mEq/L   Potassium 4.2  3.5 - 5.1 mEq/L   Chloride 97  96 -  112 mEq/L   CO2 22  19 - 32 mEq/L   Glucose, Bld 107 (*) 70 - 99 mg/dL   BUN 11  6 - 23 mg/dL   Creatinine, Ser 6.44  0.50 - 1.35 mg/dL   Calcium 9.1  8.4 - 03.4 mg/dL   Total Protein 7.0  6.0 - 8.3 g/dL   Albumin 4.0  3.5 - 5.2 g/dL   AST 19  0 - 37 U/L   ALT 17  0 - 53 U/L   Alkaline Phosphatase 53  39 - 117 U/L   Total Bilirubin 0.5  0.3 - 1.2 mg/dL   GFR calc non Af Amer 89 (*) >90 mL/min   GFR calc Af Amer >90  >90 mL/min  LIPASE, BLOOD      Result Value Range   Lipase 21  11 - 59 U/L  TROPONIN I      Result Value Range   Troponin I <0.30  <0.30 ng/mL  URINALYSIS W MICROSCOPIC + REFLEX CULTURE      Result Value Range   Color, Urine YELLOW  YELLOW   APPearance HAZY (*) CLEAR   Specific Gravity, Urine 1.019  1.005 - 1.030   pH 7.0  5.0 - 8.0   Glucose, UA NEGATIVE  NEGATIVE mg/dL   Hgb urine dipstick NEGATIVE  NEGATIVE   Bilirubin Urine NEGATIVE  NEGATIVE   Ketones, ur 15 (*) NEGATIVE mg/dL   Protein, ur NEGATIVE  NEGATIVE mg/dL   Urobilinogen, UA 1.0  0.0 - 1.0 mg/dL   Nitrite NEGATIVE  NEGATIVE   Leukocytes, UA NEGATIVE  NEGATIVE   WBC, UA 0-2  <3 WBC/hpf   RBC / HPF 0-2  <3 RBC/hpf   Bacteria, UA MANY (*) RARE   Squamous Epithelial / LPF RARE  RARE   Urine-Other AMORPHOUS URATES/PHOSPHATES     Dg Chest 2 View 07/30/2013   CLINICAL DATA:  Nausea for 2 days.  EXAM: CHEST  2 VIEW  COMPARISON:  02/09/2013  FINDINGS: The cardiomediastinal silhouette is unchanged. No focal infiltrate or edema. No pleural effusion or pneumothorax. No acute osseous abnormality.  IMPRESSION: No active cardiopulmonary disease.   Electronically Signed   By: Jerene Dilling M.D.   On: 07/30/2013 15:43   Dg Lumbar Spine  Complete 07/30/2013   CLINICAL DATA:  Low back pain for several days with numbness in both feet  EXAM: LUMBAR SPINE - COMPLETE 4+ VIEW  COMPARISON:  None  FINDINGS: Five non-rib bearing lumbar vertebrae.  Osseous mineralization grossly normal.  Vertebral body and disk space heights maintained.  No acute fracture, subluxation or bone destruction.  No spondylolysis.  SI joints symmetric.  Scattered atherosclerotic calcifications.  IMPRESSION: No acute lumbar spine abnormalities.   Electronically Signed   By: Ulyses Southward M.D.   On: 07/30/2013 15:44   Ct Abdomen Pelvis W Contrast 07/30/2013   CLINICAL DATA:  Weight loss. Nausea and back pain.  EXAM: CT ABDOMEN AND PELVIS WITH CONTRAST  TECHNIQUE: Multidetector CT imaging of the abdomen and pelvis was performed using the standard protocol following bolus administration of intravenous contrast.  CONTRAST:  OMNIPAQUE IOHEXOL 300 MG/ML  SOLN  COMPARISON:  None  FINDINGS: There is a tiny nodule within the left base measuring 2 mm, image 1/series 3. Adjacent 3 mm nodule is also noted. Within the right base there is a 5 mm nodule. No pleural or pericardial effusion identified.  No liver abnormality identified. Cyst is noted within the left hepatic lobe. Along the dome  of the liver there is a 5 mm low attenuation structure. Gallbladder is normal. No biliary dilatation. Normal appearance of the pancreas. The spleen is normal.  The adrenal glands are both normal. Tiny low attenuation structure within the right kidney is too small to characterize measuring 2 mm, image 27/series 2. The left kidney is normal. Urinary bladder appears normal. There are seed implants identified within the prostate gland. The seminal vesicles are on unremarkable.  Calcified atherosclerotic disease affects the abdominal aorta. No aneurysm. There is no upper abdominal adenopathy identified. No pelvic or inguinal adenopathy noted. A small amount of free fluid is noted within the dependent portion  of the pelvis.  The stomach appears normal. The small bowel loops are unremarkable. The appendix is visualized and appears normal. Normal appearance of the colon.  A trace amount of free fluid noted within the pelvis.  Review of the visualized osseous structures is negative for aggressive lytic or sclerotic bone abnormality. Bilateral L5 pars defects are noted.  IMPRESSION: 1. No findings identified to explain the patient's weight loss. 2. Prostate gland seed implants. 3. Trace free fluid is identified within the pelvis this is a nonspecific finding. 4. Small nonspecific pulmonary nodules are identified within both lung bases. The largest is in the right base measuring 5 mm. If the patient is at high risk for bronchogenic carcinoma, follow-up chest CT at 6-12 months is recommended. If the patient is at low risk for bronchogenic carcinoma, follow-up chest CT at 12 months is recommended. This recommendation follows the consensus statement: Guidelines for Management of Small Pulmonary Nodules Detected on CT Scans: A Statement from the Fleischner Society as published in Radiology 2005;237:395-400.   Electronically Signed   By: Signa Kell M.D.   On: 07/30/2013 18:23    1915:  Pt has tol PO well while in the ED without N/V.  No stooling while in the ED.  Abd remains benign, VSS. Feels better and wants to go home now. Pt has ambulated around the ED with steady upright gait, resps easy, NAD. Pt's wife is at the bedside, she would like to take pt home now. Pt has GI MD appt on Monday; encouraged to keep this appt. Will tx symptomatically at this time. Dx and testing d/w pt and family.  Questions answered.  Verb understanding, agreeable to d/c home with outpt f/u.     Laray Anger, DO 08/02/13 [phone]

## 2013-07-30 NOTE — ED Notes (Addendum)
Pt reports he has been in ER several times this month for anxiety and cannot keep anything down for several days. He is taking meds for anxiety and seeing family MD. Has GI appt Monday but he feels he cannot wait until then. Also reports lower back pain worse when he sits back and pressure is put on it. Reports he has lost 10 pounds in past 10 days. Is worried he has pancreatic cancer.

## 2013-07-30 NOTE — ED Notes (Signed)
Patient states he has been self diagnosing and thinks he has pancreatic cancer due to his increased lower back pain. He also states he has been taking his Prozac intermittently.

## 2013-07-30 NOTE — ED Notes (Signed)
Oral contrast given to patient

## 2013-08-02 ENCOUNTER — Inpatient Hospital Stay: Admission: RE | Admit: 2013-08-02 | Payer: Medicare Other | Source: Ambulatory Visit

## 2014-09-07 ENCOUNTER — Other Ambulatory Visit: Payer: Self-pay | Admitting: Family Medicine

## 2014-09-07 DIAGNOSIS — R918 Other nonspecific abnormal finding of lung field: Secondary | ICD-10-CM

## 2014-09-20 ENCOUNTER — Ambulatory Visit
Admission: RE | Admit: 2014-09-20 | Discharge: 2014-09-20 | Disposition: A | Discharge status: Home / Self Care | Payer: Medicare Other | Source: Ambulatory Visit | Attending: Family Medicine | Admitting: Family Medicine

## 2014-09-20 DIAGNOSIS — R918 Other nonspecific abnormal finding of lung field: Secondary | ICD-10-CM

## 2015-01-03 ENCOUNTER — Other Ambulatory Visit: Payer: Self-pay | Admitting: Dermatology

## 2015-02-15 ENCOUNTER — Encounter: Payer: Self-pay | Admitting: Nurse Practitioner

## 2015-03-03 ENCOUNTER — Encounter: Payer: Self-pay | Admitting: Nurse Practitioner

## 2015-03-03 ENCOUNTER — Ambulatory Visit (INDEPENDENT_AMBULATORY_CARE_PROVIDER_SITE_OTHER): Payer: Medicare HMO | Admitting: Nurse Practitioner

## 2015-03-03 ENCOUNTER — Encounter: Payer: Self-pay | Admitting: Gastroenterology

## 2015-03-03 VITALS — BP 164/80 | HR 100 | Ht 67.75 in | Wt 164.1 lb

## 2015-03-03 DIAGNOSIS — R1310 Dysphagia, unspecified: Secondary | ICD-10-CM

## 2015-03-03 DIAGNOSIS — R194 Change in bowel habit: Secondary | ICD-10-CM

## 2015-03-03 DIAGNOSIS — R933 Abnormal findings on diagnostic imaging of other parts of digestive tract: Secondary | ICD-10-CM | POA: Diagnosis not present

## 2015-03-03 HISTORY — DX: Dysphagia, unspecified: R13.10

## 2015-03-03 NOTE — Patient Instructions (Signed)
It has been recommended to you by your physician that you have a(n) Endoscopy completed. Per your request, we did not schedule the procedure(s) today. Please contact our office at [phone] should you decide to have the procedure completed. 

## 2015-03-03 NOTE — Progress Notes (Signed)
HPI :  Patient is a 74 year old male referred by PCP for evaluation of chronic diarrhea. He is a former patient of [facility] but they are no longer in his insurance network.   Patient clarifies that he doesn't actually have chronic diarrhea. He has had a bowel change consisting of increased frequency of soft, semi-formed stools 3 times a day. Stools narrow in caliber. He has associated gas and bloating. Patient states he is an anxious person and worries a lot about his health. Health related anxiety started a couple of years ago when patient was diagnosed with melanoma on his back. While there is no evidence of metastatic disease patient began to pay attention to every ache and pain he had. Patient went to the emergency department in 2014 with weight loss, nausea and vomiting. CT scan of the abdomen and pelvis with contrast showed small nonspecific pulmonary nodules. The multiple lung nodules were unchanged on CT scan of the chest December 2015.  Patient describes intermittent difficulty swallowing liquids. Liquids seem to "build up" in lower esophagus. No problems swallowing solids. His chest CT scan Dec 2015 showed a dilated esophagus  Past Medical History  Diagnosis Date  . Prostate cancer   . Skin cancer   . Hypertension   . Anxiety   . ED (erectile dysfunction)   . Pulmonary nodules   . Colon polyps   . Depression     Family History  Problem Relation Age of Onset  . Breast cancer Mother   . Prostate cancer Maternal Grandfather   . Heart disease Father    History  Substance Use Topics  . Smoking status: Never Smoker   . Smokeless tobacco: Never Used  . Alcohol Use: 4.2 oz/week    7 Cans of beer per week   Current Outpatient Prescriptions  Medication Sig Dispense Refill  . amLODipine (NORVASC) 5 MG tablet Take 5 mg by mouth daily.    Marland Kitchen aspirin EC 81 MG tablet Take 81 mg by mouth daily.    Marland Kitchen LORazepam (ATIVAN) 1 MG tablet Take 0.5 mg by mouth as needed for anxiety.     .  sertraline (ZOLOFT) 50 MG tablet Take 50 mg by mouth daily.     No current facility-administered medications for this visit.   Allergies  Allergen Reactions  . Bee Venom Hives and Swelling     Review of Systems: All systems reviewed and negative except where noted in HPI.  Physical Exam: BP 164/80 mmHg  Pulse 100  Ht 5' 7.75" (1.721 m)  Wt 164 lb 2 oz (74.447 kg)  BMI 25.14 kg/m2 Constitutional: Pleasant,well-developed, white male in no acute distress. HEENT: Normocephalic and atraumatic. Conjunctivae are normal. No scleral icterus. Neck supple.  Cardiovascular: Normal rate, regular rhythm.  Pulmonary/chest: Effort normal and breath sounds normal. No wheezing, rales or rhonchi. Abdominal: Soft, nondistended, nontender. Bowel sounds active throughout. There are no masses palpable. No hepatomegaly. Extremities: no edema Lymphadenopathy: No cervical adenopathy noted. Neurological: Alert and oriented to person place and time. Skin: Skin is warm and dry. No rashes noted. Psychiatric: Normal mood and affect. Behavior is normal.   ASSESSMENT AND PLAN:  1. Pleasant 74 year old male with change in bowel habits, associated gas and bloating. He likely has irritable bowel syndrome but it isn't unreasonable to proceed with colonoscopy since his last one was 8 years ago. Given problems swallowing fluids will proceed with EGD first.   2. History of adenomatous colon polyps 2004. No polyps on last colonoscopy  May 2009 by [facility]. Marland Kitchen  3. Intermittent dysphagia to liquids, see HPI. Chest CTscan Dec 2015 showed dilated esophagus. Will schedule patient for EGD for further evaluation. Patient wants to call back and arrange pre-visit to schedule, he needs to discuss with wife.   4. Melanoma (on back). No evidence of metastatic disease per patient. See #5  5. Pulmonary nodules on CT scan 2014, concerning with history of melanoma but nodules stable on  follow up CTscan Dec 2015  Cc: Dibas Dorthy Cooler,  MD

## 2015-03-06 NOTE — Progress Notes (Signed)
Reviewed and agree with management. Robert D. Kaplan, M.D., FACG  

## 2015-04-06 ENCOUNTER — Ambulatory Visit (AMBULATORY_SURGERY_CENTER): Payer: Self-pay

## 2015-04-06 VITALS — Ht 68.0 in | Wt 165.4 lb

## 2015-04-06 DIAGNOSIS — R933 Abnormal findings on diagnostic imaging of other parts of digestive tract: Secondary | ICD-10-CM

## 2015-04-06 NOTE — Progress Notes (Signed)
No allergies to eggs or soy No diet/weight loss meds No home oxygen No past problems with anesthesia  Does not want emmi video.  Stated, "I'd rather not know".

## 2015-04-20 ENCOUNTER — Ambulatory Visit (AMBULATORY_SURGERY_CENTER): Payer: Medicare HMO | Admitting: Gastroenterology

## 2015-04-20 ENCOUNTER — Encounter: Payer: Self-pay | Admitting: Gastroenterology

## 2015-04-20 ENCOUNTER — Other Ambulatory Visit: Payer: Self-pay

## 2015-04-20 ENCOUNTER — Telehealth: Payer: Self-pay

## 2015-04-20 VITALS — BP 117/64 | HR 74 | Temp 98.0°F | Resp 18 | Ht 68.0 in | Wt 165.0 lb

## 2015-04-20 DIAGNOSIS — R933 Abnormal findings on diagnostic imaging of other parts of digestive tract: Secondary | ICD-10-CM

## 2015-04-20 DIAGNOSIS — K2970 Gastritis, unspecified, without bleeding: Secondary | ICD-10-CM | POA: Diagnosis not present

## 2015-04-20 DIAGNOSIS — K2950 Unspecified chronic gastritis without bleeding: Secondary | ICD-10-CM | POA: Diagnosis not present

## 2015-04-20 DIAGNOSIS — K2990 Gastroduodenitis, unspecified, without bleeding: Principal | ICD-10-CM

## 2015-04-20 DIAGNOSIS — B9681 Helicobacter pylori [H. pylori] as the cause of diseases classified elsewhere: Secondary | ICD-10-CM | POA: Diagnosis not present

## 2015-04-20 DIAGNOSIS — R198 Other specified symptoms and signs involving the digestive system and abdomen: Secondary | ICD-10-CM

## 2015-04-20 MED ORDER — SODIUM CHLORIDE 0.9 % IV SOLN: 500.0000 mL | INTRAVENOUS | Status: DC

## 2015-04-20 MED ORDER — FAMOTIDINE 40 MG PO TABS: 40.0000 mg | ORAL_TABLET | Freq: Every day | ORAL | Status: DC

## 2015-04-20 NOTE — Progress Notes (Signed)
Transferred to recovery room. A/O x3, pleased with MAC.  VSS.  Report to Annette, RN. 

## 2015-04-20 NOTE — Progress Notes (Signed)
Notified CRNA ,Tera Helper, of pt's elevated BP 166/112 , 176/106 on admission today and that pt took his Bp med this am.

## 2015-04-20 NOTE — Op Note (Signed)
[facility]  [address] [place], [id number]   ENDOSCOPY PROCEDURE REPORT  PATIENT: Douglas Fritz, Douglas Fritz  MR#: [id number] BIRTHDATE: June 03, 1941 , 73  yrs. old GENDER: male ENDOSCOPIST: Inda Castle, MD REFERRED BY: PROCEDURE DATE:  04/20/2015 PROCEDURE:  EGD w/ biopsy ASA CLASS:     Class II INDICATIONS:  dysphagia. MEDICATIONS: Monitored anesthesia care and Propofol 250 mg IV TOPICAL ANESTHETIC:  DESCRIPTION OF PROCEDURE: After the risks benefits and alternatives of the procedure were thoroughly explained, informed consent was obtained.  The LB [id number] endoscope was introduced through the mouth and advanced to the second portion of the duodenum , Without limitations.  The instrument was slowly withdrawn as the mucosa was fully examined.    Irregular GE junction.   Irregular GE junction. there were islands of gastric appearing epithelium extending approximately 2 cm proximally.  Multiple biopsies were taken to rule out Barrett's esophagus. the esophagus appeared diffusely dilated.  In the stomach there were multiple superficial erosions in the gastric body and antrum.  Biopsies were taken.  There was underlying erythema.  there was mild erythema in the apex of the duodenal bulb. Retroflexed views revealed no abnormalities.     The scope was then withdrawn from the patient and the procedure completed.  COMPLICATIONS: There were no immediate complications.  ENDOSCOPIC IMPRESSION: 1.   Irregular GE junction - rule out Barrett's esophagus 2.   dilated esophagus 3.  Erosive gastroduodenitis  RECOMMENDATIONS:  1.  begin Pepcid 40 mg daily 2.  Esophageal manometry  REPEAT EXAM:  eSigned:  Inda Castle, MD 04/20/2015 3:57 PM    CC: Durenda Hurt, MD  PATIENT NAME:  Douglas Fritz, Douglas Fritz MR#: [id number]

## 2015-04-20 NOTE — Telephone Encounter (Signed)
Left message to call back. Esophageal manometry is scheduled at [facility] on 05/05/15 at 10:30 am. He should arrive at 9:45 am fasting.

## 2015-04-20 NOTE — Progress Notes (Signed)
Called to room to assist during endoscopic procedure.  Patient ID and intended procedure confirmed with present staff. Received instructions for my participation in the procedure from the performing physician.  

## 2015-04-20 NOTE — Patient Instructions (Signed)
YOU HAD AN ENDOSCOPIC PROCEDURE TODAY AT [facility]:   Refer to the procedure report that was given to you for any specific questions about what was found during the examination.  If the procedure report does not answer your questions, please call your gastroenterologist to clarify.  If you requested that your care partner not be given the details of your procedure findings, then the procedure report has been included in a sealed envelope for you to review at your convenience later.  YOU SHOULD EXPECT: Some feelings of bloating in the abdomen. Passage of more gas than usual.  Walking can help get rid of the air that was put into your GI tract during the procedure and reduce the bloating. If you had a lower endoscopy (such as a colonoscopy or flexible sigmoidoscopy) you may notice spotting of blood in your stool or on the toilet paper. If you underwent a bowel prep for your procedure, you may not have a normal bowel movement for a few days.  Please Note:  You might notice some irritation and congestion in your nose or some drainage.  This is from the oxygen used during your procedure.  There is no need for concern and it should clear up in a day or so.  SYMPTOMS TO REPORT IMMEDIATELY:   Following upper endoscopy (EGD)  Vomiting of blood or coffee ground material  New chest pain or pain under the shoulder blades  Painful or persistently difficult swallowing  New shortness of breath  Fever of 100F or higher  Black, tarry-looking stools  For urgent or emergent issues, a gastroenterologist can be reached at any hour by calling [phone].   DIET: Your first meal following the procedure should be a small meal and then it is ok to progress to your normal diet. Heavy or fried foods are harder to digest and may make you feel nauseous or bloated.  Likewise, meals heavy in dairy and vegetables can increase bloating.  Drink plenty of fluids but you should avoid alcoholic beverages for  24 hours.  ACTIVITY:  You should plan to take it easy for the rest of today and you should NOT DRIVE or use heavy machinery until tomorrow (because of the sedation medicines used during the test).    FOLLOW UP: Our staff will call the number listed on your records the next business day following your procedure to check on you and address any questions or concerns that you may have regarding the information given to you following your procedure. If we do not reach you, we will leave a message.  However, if you are feeling well and you are not experiencing any problems, there is no need to return our call.  We will assume that you have returned to your regular daily activities without incident.  If any biopsies were taken you will be contacted by phone or by letter within the next 1-3 weeks.  Please call us at [phone] if you have not heard about the biopsies in 3 weeks.    SIGNATURES/CONFIDENTIALITY: You and/or your care partner have signed paperwork which will be entered into your electronic medical record.  These signatures attest to the fact that that the information above on your After Visit Summary has been reviewed and is understood.  Full responsibility of the confidentiality of this discharge information lies with you and/or your care-partner.    Dr. Kelby Fam nurse will call you with an appointment for a manometry and a follow up appointment to  see Dr. Deatra Ina. Begin PEPCID 40 mg daily.  Rx was sent to your pharmacy by Dr. Deatra Ina. You may resume your current medications today. Await biopsy results. Please call if any questions or concerns.

## 2015-04-21 ENCOUNTER — Telehealth: Payer: Self-pay | Admitting: *Deleted

## 2015-04-21 NOTE — Telephone Encounter (Signed)
  Follow up Call-  Call back number 04/20/2015  Post procedure Call Back phone  # [phone]  Permission to leave phone message Yes     Patient questions:  Do you have a fever, pain , or abdominal swelling? No. Pain Score  0 *  Have you tolerated food without any problems? Yes.    Have you been able to return to your normal activities? Yes.    Do you have any questions about your discharge instructions: Diet   No. Medications  No. Follow up visit  No.  Do you have questions or concerns about your Care? No.  Actions: * If pain score is 4 or above: No action needed, pain <4.  Per wife pt is out walking the dogs.

## 2015-04-25 ENCOUNTER — Encounter: Payer: Self-pay | Admitting: Gastroenterology

## 2015-04-25 NOTE — Telephone Encounter (Signed)
Patient needed to reschedule the appointment. Next available 06/12/15 at 10:30 am. Arrive fasting at 10:00 am. Patient accepts this appointment.

## 2015-04-25 NOTE — Progress Notes (Signed)
Quick Note:  Pt has delayed gastric emptying c/w gastroparesis   ______

## 2015-04-26 ENCOUNTER — Other Ambulatory Visit: Payer: Self-pay

## 2015-04-26 MED ORDER — AMOXICILL-CLARITHRO-LANSOPRAZ PO MISC: Freq: Two times a day (BID) | ORAL | Status: DC

## 2015-04-27 ENCOUNTER — Telehealth: Payer: Self-pay | Admitting: Gastroenterology

## 2015-04-27 NOTE — Telephone Encounter (Signed)
Spoke with pharmacy resent in [place] separately so insurance will cover it... Called patient to inform

## 2015-05-09 ENCOUNTER — Telehealth: Payer: Self-pay | Admitting: Gastroenterology

## 2015-05-09 NOTE — Telephone Encounter (Signed)
Patient had a delay in starting the Prepack. He also has not had the manometry. Appointment rescheduled for 06/16/15

## 2015-05-15 ENCOUNTER — Ambulatory Visit: Payer: Medicare HMO | Admitting: Gastroenterology

## 2015-06-08 ENCOUNTER — Telehealth: Payer: Self-pay | Admitting: Gastroenterology

## 2015-06-08 NOTE — Telephone Encounter (Signed)
If he is not having dysphagia then he does not need to have the manometry

## 2015-06-08 NOTE — Telephone Encounter (Signed)
Treated for H-pylori. Doing better. Wants to know if the esophageal manometry is necessary.

## 2015-06-08 NOTE — Telephone Encounter (Signed)
Patient denies any dysphagia. Procedure cancelled.

## 2015-06-12 ENCOUNTER — Encounter (HOSPITAL_COMMUNITY): Admission: RE | Payer: Self-pay | Source: Ambulatory Visit

## 2015-06-12 ENCOUNTER — Ambulatory Visit (HOSPITAL_COMMUNITY): Admission: RE | Admit: 2015-06-12 | Payer: Medicare HMO | Source: Ambulatory Visit | Admitting: Gastroenterology

## 2015-06-12 SURGERY — MANOMETRY, ESOPHAGUS
Anesthesia: Choice

## 2015-06-16 ENCOUNTER — Encounter: Payer: Self-pay | Admitting: Gastroenterology

## 2015-06-16 ENCOUNTER — Ambulatory Visit (INDEPENDENT_AMBULATORY_CARE_PROVIDER_SITE_OTHER): Payer: Medicare HMO | Admitting: Gastroenterology

## 2015-06-16 VITALS — BP 152/88 | HR 88 | Ht 67.75 in | Wt 166.1 lb

## 2015-06-16 DIAGNOSIS — K2270 Barrett's esophagus without dysplasia: Secondary | ICD-10-CM | POA: Diagnosis not present

## 2015-06-16 DIAGNOSIS — Z86010 Personal history of colonic polyps: Secondary | ICD-10-CM

## 2015-06-16 DIAGNOSIS — R1013 Epigastric pain: Secondary | ICD-10-CM | POA: Diagnosis not present

## 2015-06-16 DIAGNOSIS — R1310 Dysphagia, unspecified: Secondary | ICD-10-CM | POA: Diagnosis not present

## 2015-06-16 NOTE — Assessment & Plan Note (Signed)
In view of his dilated esophagus I suspect that he may have a motility disorder.  Since symptoms are quite minimal would not pursue any further at this time.

## 2015-06-16 NOTE — Assessment & Plan Note (Signed)
Plan follow-up endoscopy one year

## 2015-06-16 NOTE — Assessment & Plan Note (Signed)
Symptoms are clearly improved since undergoing therapy for H. pylori and for gastroduodenitis.

## 2015-06-16 NOTE — Progress Notes (Signed)
      History of Present Illness:  Douglas Fritz has returned for follow-up of dyspepsia.  Upper endoscopy demonstrated a dilated esophagus and changes consistent with Barrett's esophagus.  This was confirmed by biopsy.  [name] artery was also seen from biopsies of the stomach that showed mild gastritis.  He reports resolution of bloating abdominal discomfort following treatment for H pylori.  He also claims that his dysphagia is quite minimal.  He only experiences dysphagia to liquids when he drinks very quickly.  He denies dysphagia to solids.  He has a history of adenomatous polyps.  Last colonoscopy in 2009 was negative for polyps.    Review of Systems: Pertinent positive and negative review of systems were noted in the above HPI section. All other review of systems were otherwise negative.    Current Medications, Allergies, Past Medical History, Past Surgical History, Family History and Social History were reviewed in [facility] record  Vital signs were reviewed in today's medical record. Physical Exam: General: Well developed , well nourished, no acute distress   See Assessment and Plan under Problem List

## 2015-06-16 NOTE — Patient Instructions (Signed)
It has been recommended to you by your physician that you have a(n)Colonoscopy  completed. Per your request, we did not schedule the procedure(s) today. Please contact our office at [phone] should you decide to have the procedure completed. 

## 2015-06-16 NOTE — Assessment & Plan Note (Signed)
Plan followup colonoscopy 

## 2015-07-21 ENCOUNTER — Telehealth: Payer: Self-pay | Admitting: *Deleted

## 2015-07-21 NOTE — Telephone Encounter (Signed)
I have left a message asking patient to schedule office visit with new provider in our clinic in wake of Dr Deatra Ina leaving [facility]. He needs to do this so we can continue to refill famotidine.

## 2015-07-24 ENCOUNTER — Other Ambulatory Visit: Payer: Self-pay

## 2015-07-24 DIAGNOSIS — Z23 Encounter for immunization: Secondary | ICD-10-CM | POA: Diagnosis not present

## 2015-07-24 MED ORDER — FAMOTIDINE 40 MG PO TABS: 40.0000 mg | ORAL_TABLET | Freq: Every day | ORAL | Status: DC

## 2015-07-31 DIAGNOSIS — R69 Illness, unspecified: Secondary | ICD-10-CM | POA: Diagnosis not present

## 2015-07-31 DIAGNOSIS — I10 Essential (primary) hypertension: Secondary | ICD-10-CM | POA: Diagnosis not present

## 2015-09-27 ENCOUNTER — Ambulatory Visit (INDEPENDENT_AMBULATORY_CARE_PROVIDER_SITE_OTHER): Payer: Medicare HMO | Admitting: Gastroenterology

## 2015-09-27 ENCOUNTER — Encounter: Payer: Self-pay | Admitting: Gastroenterology

## 2015-09-27 ENCOUNTER — Other Ambulatory Visit (INDEPENDENT_AMBULATORY_CARE_PROVIDER_SITE_OTHER): Payer: Medicare HMO

## 2015-09-27 VITALS — BP 170/84 | HR 72 | Ht 67.75 in | Wt 168.1 lb

## 2015-09-27 DIAGNOSIS — K219 Gastro-esophageal reflux disease without esophagitis: Secondary | ICD-10-CM | POA: Diagnosis not present

## 2015-09-27 DIAGNOSIS — K2270 Barrett's esophagus without dysplasia: Secondary | ICD-10-CM

## 2015-09-27 LAB — BASIC METABOLIC PANEL
BUN: 21 mg/dL (ref 6–23)
CALCIUM: 9.4 mg/dL (ref 8.4–10.5)
CO2: 27 mEq/L (ref 19–32)
CREATININE: 0.87 mg/dL (ref 0.40–1.50)
Chloride: 102 mEq/L (ref 96–112)
GFR: 90.87 mL/min (ref >60.00)
Glucose, Bld: 102 mg/dL — ABNORMAL HIGH (ref 70–99)
Potassium: 4 mEq/L (ref 3.5–5.1)
Sodium: 135 mEq/L (ref 135–145)

## 2015-09-27 MED ORDER — OMEPRAZOLE 20 MG PO CPDR: 20.0000 mg | DELAYED_RELEASE_CAPSULE | Freq: Every day | ORAL | Status: DC

## 2015-09-27 NOTE — Progress Notes (Signed)
HPI :  75 y/o male here for follow up for Barrett's esophagus, newly diagnosed in July 2016 on first EGD. EGD report as outlined below.   He denies any significant heartburn at this time. He takes pepcid once daily. He denies any difficulty swallowing. Eating well, no vomiting. He reports his dyspepsia has resolved after treatment for H pylori. No abdominal pains or postprandial symptoms. He reports a history of periodic heartburn in the past, usually worse with stress. He has been on pepcid since his EGD. He has never been on PPIs. No FH of esophageal cancer. No tobacco history. No history of osteoporosis/penia, no CKD.   No problems with bowel habits at all. No blood in the stools. Otherwise he denies any complaints and feel well. He thinks he had a remote history of colon polyps however last colonoscopy in 2009 was normal, no report of older colonoscopy.   EGD 04/20/2015 - irregular GEJ with islands up to 2cm from GEJ, biopsies with intestinal metaplasia without dysplasia, dilated esophagus Colonoscopy - 01/2008 - hemorrhoids, otherwise normal  Past Medical History  Diagnosis Date  . Prostate cancer ([place])   . Skin cancer     melanoma  . Hypertension   . Anxiety   . ED (erectile dysfunction)   . Pulmonary nodules   . Colon polyps   . Depression   . Barrett's esophagus without dysplasia dx 2016     Past Surgical History  Procedure Laterality Date  . Knee surgery Right   . Melanoma excision  11/2012  . Insertion prostate radiation seed  1997   Family History  Problem Relation Age of Onset  . Breast cancer Mother   . Prostate cancer Maternal Grandfather   . Heart disease Father   . Colon cancer Neg Hx    Social History  Substance Use Topics  . Smoking status: Never Smoker   . Smokeless tobacco: Never Used  . Alcohol Use: 4.2 oz/week    7 Cans of beer per week   Current Outpatient Prescriptions  Medication Sig Dispense Refill  . amLODipine (NORVASC) 5 MG tablet Take 5 mg  by mouth daily.    Marland Kitchen aspirin EC 81 MG tablet Take 81 mg by mouth daily.    . famotidine (PEPCID) 40 MG tablet Take 1 tablet (40 mg total) by mouth daily. 30 tablet 2  . sertraline (ZOLOFT) 100 MG tablet Take 100 mg by mouth daily.    Marland Kitchen omeprazole (PRILOSEC) 20 MG capsule Take 1 capsule (20 mg total) by mouth daily. 90 capsule 3   No current facility-administered medications for this visit.   Allergies  Allergen Reactions  . Bee Venom Hives and Swelling     Review of Systems: All systems reviewed and negative except where noted in HPI.   Lab Results  Component Value Date   WBC 7.6 07/30/2013   HGB 15.6 07/30/2013   HCT 42.3 07/30/2013   MCV 90.4 07/30/2013   PLT 295 07/30/2013    Lab Results  Component Value Date   CREATININE 0.75 07/30/2013   BUN 11 07/30/2013   NA 131* 07/30/2013   K 4.2 07/30/2013   CL 97 07/30/2013   CO2 22 07/30/2013     Physical Exam: BP 170/84 mmHg  Pulse 72  Ht 5' 7.75" (1.721 m)  Wt 168 lb 2 oz (76.261 kg)  BMI 25.75 kg/m2 Constitutional: Pleasant,well-developed, male in no acute distress. HEENT: Normocephalic and atraumatic. Conjunctivae are normal. No scleral icterus. Neck supple.  Cardiovascular:  Normal rate, regular rhythm.  Pulmonary/chest: Effort normal and breath sounds normal. No wheezing, rales or rhonchi. Abdominal: Soft, nondistended, nontender. Bowel sounds active throughout. There are no masses palpable. No hepatomegaly. Extremities: no edema Lymphadenopathy: No cervical adenopathy noted. Neurological: Alert and oriented to person place and time. Skin: Skin is warm and dry. No rashes noted. Psychiatric: Normal mood and affect. Behavior is normal.   ASSESSMENT AND PLAN: 75 y/o male with a relatively new diagnosis of non-dysplastic Barrett's esophagus in July 2016. Images reviewed. Irregular z-line for the most part with some islands of salmon colored mucosa reported up to 2cm in length. No nodularity. I discussed BE in  general with the patient and reviewed the latest ACG guidelines on Barrett's management with him. The risk of malignancy from short segment nondysplastic Barrett's in general is very low. While surveillance is recommended for this issue, based on guidelines it should be done next 3 years from his diagnosis, not one year, and clarified this for him. Otherwise, he had not taken any antacids prior to his diagnosis. Now on pepcid. Recent guidelines recommend once daily PPI in patient's with Barrett's as studies have shown that patients maintained on PPI have a lower risk of progression to neoplastic Barrett's than those maintained on H2 blocker. Given the low cost of generic PPI, and favorable side effect profile in most patients, ACG recommends starting patient on PPI even in the absence of symptoms. Recommend we stop pepcid in this light and transition him to omeprazole 20mg  per day. The risks of long term PPIs with current data include increased risk for chronic kidney disease, increased risk of fracture, increased risk of C diff, increased risk of pneumonia (short term usage), potentially increased risk of dementia, potentially increased risk of B12 / calcium deficiency, and rare risk of hypomagnesemia. Renal function is will be checked now to ensure normal, and would recommend checking this yearly while on PPI therapy to ensure stable. Otherwise he will follow up in one year for this issue. All questions answered he agreed to the plan.   Otherwise, in regards to the report of a dilated esophagus on EGD, there had been consideration for manometry to rule out dysmotility however the patient has no dysphagia at present time. We can consider this if he develops dysphagia, otherwise asymptomatic.  Of note for CRC screening, next due in 2019 based on his last exam in 2009. Asymptomatic.     George Cellar, MD [facility] Gastroenterology Pager [phone]

## 2015-09-27 NOTE — Patient Instructions (Signed)
We have sent the following medications to your pharmacy for you to pick up at your convenience: Omeprazole 20mg .  Your physician has requested that you go to the basement for the following lab work before leaving today: BMET

## 2015-10-03 DIAGNOSIS — L738 Other specified follicular disorders: Secondary | ICD-10-CM | POA: Diagnosis not present

## 2015-10-03 DIAGNOSIS — D225 Melanocytic nevi of trunk: Secondary | ICD-10-CM | POA: Diagnosis not present

## 2015-10-03 DIAGNOSIS — Z85828 Personal history of other malignant neoplasm of skin: Secondary | ICD-10-CM | POA: Diagnosis not present

## 2015-10-03 DIAGNOSIS — Z85820 Personal history of malignant melanoma of skin: Secondary | ICD-10-CM | POA: Diagnosis not present

## 2015-10-03 DIAGNOSIS — L308 Other specified dermatitis: Secondary | ICD-10-CM | POA: Diagnosis not present

## 2015-10-03 DIAGNOSIS — L821 Other seborrheic keratosis: Secondary | ICD-10-CM | POA: Diagnosis not present

## 2015-10-03 DIAGNOSIS — L812 Freckles: Secondary | ICD-10-CM | POA: Diagnosis not present

## 2016-01-30 DIAGNOSIS — L821 Other seborrheic keratosis: Secondary | ICD-10-CM | POA: Diagnosis not present

## 2016-01-30 DIAGNOSIS — D225 Melanocytic nevi of trunk: Secondary | ICD-10-CM | POA: Diagnosis not present

## 2016-01-30 DIAGNOSIS — D485 Neoplasm of uncertain behavior of skin: Secondary | ICD-10-CM | POA: Diagnosis not present

## 2016-01-30 DIAGNOSIS — Z85828 Personal history of other malignant neoplasm of skin: Secondary | ICD-10-CM | POA: Diagnosis not present

## 2016-01-30 DIAGNOSIS — C44719 Basal cell carcinoma of skin of left lower limb, including hip: Secondary | ICD-10-CM | POA: Diagnosis not present

## 2016-01-30 DIAGNOSIS — Z85820 Personal history of malignant melanoma of skin: Secondary | ICD-10-CM | POA: Diagnosis not present

## 2016-01-30 DIAGNOSIS — L570 Actinic keratosis: Secondary | ICD-10-CM | POA: Diagnosis not present

## 2016-03-04 DIAGNOSIS — I10 Essential (primary) hypertension: Secondary | ICD-10-CM | POA: Diagnosis not present

## 2016-03-04 DIAGNOSIS — R918 Other nonspecific abnormal finding of lung field: Secondary | ICD-10-CM | POA: Diagnosis not present

## 2016-03-04 DIAGNOSIS — R69 Illness, unspecified: Secondary | ICD-10-CM | POA: Diagnosis not present

## 2016-04-11 ENCOUNTER — Encounter: Payer: Self-pay | Admitting: Gastroenterology

## 2016-05-29 DIAGNOSIS — M1711 Unilateral primary osteoarthritis, right knee: Secondary | ICD-10-CM | POA: Diagnosis not present

## 2016-06-03 DIAGNOSIS — L570 Actinic keratosis: Secondary | ICD-10-CM | POA: Diagnosis not present

## 2016-06-03 DIAGNOSIS — D225 Melanocytic nevi of trunk: Secondary | ICD-10-CM | POA: Diagnosis not present

## 2016-06-03 DIAGNOSIS — Z85820 Personal history of malignant melanoma of skin: Secondary | ICD-10-CM | POA: Diagnosis not present

## 2016-06-03 DIAGNOSIS — D2262 Melanocytic nevi of left upper limb, including shoulder: Secondary | ICD-10-CM | POA: Diagnosis not present

## 2016-06-03 DIAGNOSIS — Z85828 Personal history of other malignant neoplasm of skin: Secondary | ICD-10-CM | POA: Diagnosis not present

## 2016-06-03 DIAGNOSIS — D2261 Melanocytic nevi of right upper limb, including shoulder: Secondary | ICD-10-CM | POA: Diagnosis not present

## 2016-06-03 DIAGNOSIS — D2271 Melanocytic nevi of right lower limb, including hip: Secondary | ICD-10-CM | POA: Diagnosis not present

## 2016-07-23 DIAGNOSIS — Z23 Encounter for immunization: Secondary | ICD-10-CM | POA: Diagnosis not present

## 2016-09-05 DIAGNOSIS — M1711 Unilateral primary osteoarthritis, right knee: Secondary | ICD-10-CM | POA: Diagnosis not present

## 2016-09-12 DIAGNOSIS — M1711 Unilateral primary osteoarthritis, right knee: Secondary | ICD-10-CM | POA: Diagnosis not present

## 2016-09-19 DIAGNOSIS — M1711 Unilateral primary osteoarthritis, right knee: Secondary | ICD-10-CM | POA: Diagnosis not present

## 2016-09-24 DIAGNOSIS — R69 Illness, unspecified: Secondary | ICD-10-CM | POA: Diagnosis not present

## 2016-09-24 DIAGNOSIS — R7301 Impaired fasting glucose: Secondary | ICD-10-CM | POA: Diagnosis not present

## 2016-09-24 DIAGNOSIS — R918 Other nonspecific abnormal finding of lung field: Secondary | ICD-10-CM | POA: Diagnosis not present

## 2016-09-24 DIAGNOSIS — I10 Essential (primary) hypertension: Secondary | ICD-10-CM | POA: Diagnosis not present

## 2016-09-25 ENCOUNTER — Other Ambulatory Visit: Payer: Self-pay | Admitting: Family Medicine

## 2016-09-25 DIAGNOSIS — R918 Other nonspecific abnormal finding of lung field: Secondary | ICD-10-CM

## 2016-09-27 ENCOUNTER — Ambulatory Visit
Admission: RE | Admit: 2016-09-27 | Discharge: 2016-09-27 | Disposition: A | Discharge status: Home / Self Care | Payer: Medicare HMO | Source: Ambulatory Visit | Attending: Family Medicine | Admitting: Family Medicine

## 2016-09-27 DIAGNOSIS — R918 Other nonspecific abnormal finding of lung field: Secondary | ICD-10-CM

## 2016-10-03 DIAGNOSIS — D485 Neoplasm of uncertain behavior of skin: Secondary | ICD-10-CM | POA: Diagnosis not present

## 2016-10-03 DIAGNOSIS — Z85820 Personal history of malignant melanoma of skin: Secondary | ICD-10-CM | POA: Diagnosis not present

## 2016-10-03 DIAGNOSIS — L821 Other seborrheic keratosis: Secondary | ICD-10-CM | POA: Diagnosis not present

## 2016-10-03 DIAGNOSIS — Z85828 Personal history of other malignant neoplasm of skin: Secondary | ICD-10-CM | POA: Diagnosis not present

## 2016-10-03 DIAGNOSIS — C44319 Basal cell carcinoma of skin of other parts of face: Secondary | ICD-10-CM | POA: Diagnosis not present

## 2016-10-03 DIAGNOSIS — L812 Freckles: Secondary | ICD-10-CM | POA: Diagnosis not present

## 2016-10-03 DIAGNOSIS — D1801 Hemangioma of skin and subcutaneous tissue: Secondary | ICD-10-CM | POA: Diagnosis not present

## 2016-10-03 DIAGNOSIS — L308 Other specified dermatitis: Secondary | ICD-10-CM | POA: Diagnosis not present

## 2016-10-23 ENCOUNTER — Telehealth: Payer: Self-pay | Admitting: Gastroenterology

## 2016-10-23 DIAGNOSIS — C44329 Squamous cell carcinoma of skin of other parts of face: Secondary | ICD-10-CM | POA: Diagnosis not present

## 2016-10-23 DIAGNOSIS — Z85828 Personal history of other malignant neoplasm of skin: Secondary | ICD-10-CM | POA: Diagnosis not present

## 2016-10-23 NOTE — Telephone Encounter (Signed)
Asked him to have his pharmacy call our office for any refill requests.

## 2016-10-28 ENCOUNTER — Telehealth: Payer: Self-pay | Admitting: Gastroenterology

## 2016-10-29 ENCOUNTER — Other Ambulatory Visit: Payer: Self-pay

## 2016-10-29 MED ORDER — OMEPRAZOLE 20 MG PO CPDR
20.0000 mg | DELAYED_RELEASE_CAPSULE | Freq: Every day | ORAL | 3 refills | Status: DC
Start: 2016-10-29 — End: 2016-12-04

## 2016-10-29 NOTE — Telephone Encounter (Signed)
Omeprazole sent to [facility]

## 2016-10-30 DIAGNOSIS — Z4802 Encounter for removal of sutures: Secondary | ICD-10-CM | POA: Diagnosis not present

## 2016-10-31 DIAGNOSIS — M1711 Unilateral primary osteoarthritis, right knee: Secondary | ICD-10-CM | POA: Diagnosis not present

## 2016-12-04 ENCOUNTER — Ambulatory Visit (INDEPENDENT_AMBULATORY_CARE_PROVIDER_SITE_OTHER): Payer: Medicare HMO | Admitting: Gastroenterology

## 2016-12-04 ENCOUNTER — Encounter: Payer: Self-pay | Admitting: Gastroenterology

## 2016-12-04 VITALS — BP 140/80 | HR 62 | Ht 67.75 in | Wt 163.0 lb

## 2016-12-04 DIAGNOSIS — K2270 Barrett's esophagus without dysplasia: Secondary | ICD-10-CM

## 2016-12-04 DIAGNOSIS — Z1211 Encounter for screening for malignant neoplasm of colon: Secondary | ICD-10-CM | POA: Diagnosis not present

## 2016-12-04 MED ORDER — OMEPRAZOLE 20 MG PO CPDR: 20.0000 mg | DELAYED_RELEASE_CAPSULE | Freq: Every day | ORAL | 3 refills | Status: DC

## 2016-12-04 NOTE — Patient Instructions (Signed)
If you are age 77 or older, your body mass index should be between 23-30. Your Body mass index is 24.97 kg/m. If this is out of the aforementioned range listed, please consider follow up with your Primary Care Provider.  If you are age 67 or younger, your body mass index should be between 19-25. Your Body mass index is 24.97 kg/m. If this is out of the aformentioned range listed, please consider follow up with your Primary Care Provider.   We have sent the following medications to your pharmacy for you to pick up at your convenience:  Omeprazole  You will be contacted when it is time to schedule your Endoscopy/Colonoscopy for May 2019.  Thank you.

## 2016-12-04 NOTE — Progress Notes (Signed)
HPI :  76 year old male here for follow-up visit for Barrett's esophagus.  He was diagnosed in July 2016 with a short segment of Barrett's esophagus without dysplasia.  Since I have seen him we have started him on omeprazole 20mg  once daily. Tolerating it well.  No breakthrough heartburn. Eating well. No dysphagia. No weight loss. No abdominal pains. No trouble with the bowels.   EGD 04/20/2015 - irregular GEJ with islands up to 2cm from GEJ, biopsies with intestinal metaplasia without dysplasia, dilated esophagus Colonoscopy - 01/2008 - hemorrhoids, otherwise normal      Past Medical History:  Diagnosis Date  . Anxiety   . Barrett's esophagus without dysplasia dx 2016  . Colon polyps   . Depression   . ED (erectile dysfunction)   . Hypertension   . Prostate cancer ([place])   . Pulmonary nodules   . Skin cancer    melanoma     Past Surgical History:  Procedure Laterality Date  . INSERTION PROSTATE RADIATION SEED  1997  . KNEE SURGERY Right   . MELANOMA EXCISION  11/2012   Family History  Problem Relation Age of Onset  . Breast cancer Mother   . Prostate cancer Maternal Grandfather   . Heart disease Father   . Colon cancer Neg Hx    Social History  Substance Use Topics  . Smoking status: Never Smoker  . Smokeless tobacco: Never Used  . Alcohol use 4.2 oz/week    7 Cans of beer per week   Current Outpatient Prescriptions  Medication Sig Dispense Refill  . amLODipine (NORVASC) 5 MG tablet Take 5 mg by mouth daily.    Marland Kitchen aspirin EC 81 MG tablet Take 81 mg by mouth daily.    Marland Kitchen omeprazole (PRILOSEC) 20 MG capsule Take 1 capsule (20 mg total) by mouth daily. 90 capsule 3  . sertraline (ZOLOFT) 100 MG tablet Take 100 mg by mouth daily.     No current facility-administered medications for this visit.    Allergies  Allergen Reactions  . Bee Venom Hives and Swelling     Review of Systems: All systems reviewed and negative except where noted in HPI.    Lab Results    Component Value Date   CREATININE 0.87 09/27/2015   BUN 21 09/27/2015   NA 135 09/27/2015   K 4.0 09/27/2015   CL 102 09/27/2015   CO2 27 09/27/2015    Lab Results  Component Value Date   ALT 17 07/30/2013   AST 19 07/30/2013   ALKPHOS 53 07/30/2013   BILITOT 0.5 07/30/2013    Lab Results  Component Value Date   CREATININE 0.87 09/27/2015   BUN 21 09/27/2015   NA 135 09/27/2015   K 4.0 09/27/2015   CL 102 09/27/2015   CO2 27 09/27/2015   Lab Results  Component Value Date   WBC 7.6 07/30/2013   HGB 15.6 07/30/2013   HCT 42.3 07/30/2013   MCV 90.4 07/30/2013   PLT 295 07/30/2013    Physical Exam: BP 140/80   Pulse 62   Ht 5' 7.75" (1.721 m)   Wt 163 lb (73.9 kg)   BMI 24.97 kg/m  Constitutional: Pleasant,well-developed, male in no acute distress. HEENT: Normocephalic and atraumatic. Conjunctivae are normal. No scleral icterus. Neck supple.  Cardiovascular: Normal rate, regular rhythm.  Pulmonary/chest: Effort normal and breath sounds normal. No wheezing, rales or rhonchi. Abdominal: Soft, nondistended, nontender.  There are no masses palpable. No hepatomegaly. Extremities: no edema Lymphadenopathy: No  cervical adenopathy noted. Neurological: Alert and oriented to person place and time. Skin: Skin is warm and dry. No rashes noted. Psychiatric: Normal mood and affect. Behavior is normal.   ASSESSMENT AND PLAN: 76 year old male here for reassessment of the following issues:  Barrett's esophagus / GERD - he has a low risk short segment Barrett's esophagus. He is on daily PPI with good control of reflux symptoms. We discussed the long-term risks and benefits of PPI, and given his underlying Barrett's esophagus PPIs recommended indefinitely. No alarm symptoms otherwise doing well without dysphagia. Recommend EGD 3 years from his last exam for surveillance, in 2019. He agreed with the plan will follow up at that time for endoscopy. Refilled omeprazole.  Colon  cancer screening - due in 2019, will perform at same time as EGD.  [place] Cellar, MD [facility] Gastroenterology Pager [phone]

## 2017-01-06 DIAGNOSIS — R7301 Impaired fasting glucose: Secondary | ICD-10-CM | POA: Diagnosis not present

## 2017-01-06 DIAGNOSIS — Z125 Encounter for screening for malignant neoplasm of prostate: Secondary | ICD-10-CM | POA: Diagnosis not present

## 2017-01-06 DIAGNOSIS — Z8546 Personal history of malignant neoplasm of prostate: Secondary | ICD-10-CM | POA: Diagnosis not present

## 2017-01-06 DIAGNOSIS — Z0000 Encounter for general adult medical examination without abnormal findings: Secondary | ICD-10-CM | POA: Diagnosis not present

## 2017-01-06 DIAGNOSIS — I10 Essential (primary) hypertension: Secondary | ICD-10-CM | POA: Diagnosis not present

## 2017-01-06 DIAGNOSIS — Z79899 Other long term (current) drug therapy: Secondary | ICD-10-CM | POA: Diagnosis not present

## 2017-01-06 DIAGNOSIS — R69 Illness, unspecified: Secondary | ICD-10-CM | POA: Diagnosis not present

## 2017-01-23 DIAGNOSIS — R740 Nonspecific elevation of levels of transaminase and lactic acid dehydrogenase [LDH]: Secondary | ICD-10-CM | POA: Diagnosis not present

## 2017-01-30 DIAGNOSIS — L821 Other seborrheic keratosis: Secondary | ICD-10-CM | POA: Diagnosis not present

## 2017-01-30 DIAGNOSIS — L812 Freckles: Secondary | ICD-10-CM | POA: Diagnosis not present

## 2017-01-30 DIAGNOSIS — D225 Melanocytic nevi of trunk: Secondary | ICD-10-CM | POA: Diagnosis not present

## 2017-01-30 DIAGNOSIS — Z85820 Personal history of malignant melanoma of skin: Secondary | ICD-10-CM | POA: Diagnosis not present

## 2017-01-30 DIAGNOSIS — L570 Actinic keratosis: Secondary | ICD-10-CM | POA: Diagnosis not present

## 2017-01-30 DIAGNOSIS — Z85828 Personal history of other malignant neoplasm of skin: Secondary | ICD-10-CM | POA: Diagnosis not present

## 2017-02-25 DIAGNOSIS — R740 Nonspecific elevation of levels of transaminase and lactic acid dehydrogenase [LDH]: Secondary | ICD-10-CM | POA: Diagnosis not present

## 2017-06-02 DIAGNOSIS — I788 Other diseases of capillaries: Secondary | ICD-10-CM | POA: Diagnosis not present

## 2017-06-02 DIAGNOSIS — D225 Melanocytic nevi of trunk: Secondary | ICD-10-CM | POA: Diagnosis not present

## 2017-06-02 DIAGNOSIS — L821 Other seborrheic keratosis: Secondary | ICD-10-CM | POA: Diagnosis not present

## 2017-06-02 DIAGNOSIS — L570 Actinic keratosis: Secondary | ICD-10-CM | POA: Diagnosis not present

## 2017-06-02 DIAGNOSIS — Z85820 Personal history of malignant melanoma of skin: Secondary | ICD-10-CM | POA: Diagnosis not present

## 2017-06-02 DIAGNOSIS — Z85828 Personal history of other malignant neoplasm of skin: Secondary | ICD-10-CM | POA: Diagnosis not present

## 2017-06-25 DIAGNOSIS — M1711 Unilateral primary osteoarthritis, right knee: Secondary | ICD-10-CM | POA: Diagnosis not present

## 2017-07-02 DIAGNOSIS — M1711 Unilateral primary osteoarthritis, right knee: Secondary | ICD-10-CM | POA: Diagnosis not present

## 2017-07-07 DIAGNOSIS — Z23 Encounter for immunization: Secondary | ICD-10-CM | POA: Diagnosis not present

## 2017-07-07 DIAGNOSIS — K2270 Barrett's esophagus without dysplasia: Secondary | ICD-10-CM | POA: Diagnosis not present

## 2017-07-07 DIAGNOSIS — R69 Illness, unspecified: Secondary | ICD-10-CM | POA: Diagnosis not present

## 2017-07-07 DIAGNOSIS — I10 Essential (primary) hypertension: Secondary | ICD-10-CM | POA: Diagnosis not present

## 2017-07-07 DIAGNOSIS — R7301 Impaired fasting glucose: Secondary | ICD-10-CM | POA: Diagnosis not present

## 2017-07-09 DIAGNOSIS — M1711 Unilateral primary osteoarthritis, right knee: Secondary | ICD-10-CM | POA: Diagnosis not present

## 2017-09-29 DIAGNOSIS — L821 Other seborrheic keratosis: Secondary | ICD-10-CM | POA: Diagnosis not present

## 2017-09-29 DIAGNOSIS — D2272 Melanocytic nevi of left lower limb, including hip: Secondary | ICD-10-CM | POA: Diagnosis not present

## 2017-09-29 DIAGNOSIS — D225 Melanocytic nevi of trunk: Secondary | ICD-10-CM | POA: Diagnosis not present

## 2017-09-29 DIAGNOSIS — D2261 Melanocytic nevi of right upper limb, including shoulder: Secondary | ICD-10-CM | POA: Diagnosis not present

## 2017-09-29 DIAGNOSIS — D2262 Melanocytic nevi of left upper limb, including shoulder: Secondary | ICD-10-CM | POA: Diagnosis not present

## 2017-09-29 DIAGNOSIS — L570 Actinic keratosis: Secondary | ICD-10-CM | POA: Diagnosis not present

## 2017-09-29 DIAGNOSIS — Z85828 Personal history of other malignant neoplasm of skin: Secondary | ICD-10-CM | POA: Diagnosis not present

## 2017-09-29 DIAGNOSIS — D2271 Melanocytic nevi of right lower limb, including hip: Secondary | ICD-10-CM | POA: Diagnosis not present

## 2017-11-13 DIAGNOSIS — M7542 Impingement syndrome of left shoulder: Secondary | ICD-10-CM | POA: Diagnosis not present

## 2018-01-14 DIAGNOSIS — R69 Illness, unspecified: Secondary | ICD-10-CM | POA: Diagnosis not present

## 2018-01-14 DIAGNOSIS — R7301 Impaired fasting glucose: Secondary | ICD-10-CM | POA: Diagnosis not present

## 2018-01-14 DIAGNOSIS — N529 Male erectile dysfunction, unspecified: Secondary | ICD-10-CM | POA: Diagnosis not present

## 2018-01-14 DIAGNOSIS — Z0000 Encounter for general adult medical examination without abnormal findings: Secondary | ICD-10-CM | POA: Diagnosis not present

## 2018-01-14 DIAGNOSIS — I10 Essential (primary) hypertension: Secondary | ICD-10-CM | POA: Diagnosis not present

## 2018-01-14 DIAGNOSIS — Z79899 Other long term (current) drug therapy: Secondary | ICD-10-CM | POA: Diagnosis not present

## 2018-01-14 DIAGNOSIS — Z8546 Personal history of malignant neoplasm of prostate: Secondary | ICD-10-CM | POA: Diagnosis not present

## 2018-01-27 DIAGNOSIS — L821 Other seborrheic keratosis: Secondary | ICD-10-CM | POA: Diagnosis not present

## 2018-01-27 DIAGNOSIS — Z85820 Personal history of malignant melanoma of skin: Secondary | ICD-10-CM | POA: Diagnosis not present

## 2018-01-27 DIAGNOSIS — L570 Actinic keratosis: Secondary | ICD-10-CM | POA: Diagnosis not present

## 2018-01-27 DIAGNOSIS — D225 Melanocytic nevi of trunk: Secondary | ICD-10-CM | POA: Diagnosis not present

## 2018-01-27 DIAGNOSIS — D692 Other nonthrombocytopenic purpura: Secondary | ICD-10-CM | POA: Diagnosis not present

## 2018-01-27 DIAGNOSIS — Z85828 Personal history of other malignant neoplasm of skin: Secondary | ICD-10-CM | POA: Diagnosis not present

## 2018-01-27 DIAGNOSIS — D1801 Hemangioma of skin and subcutaneous tissue: Secondary | ICD-10-CM | POA: Diagnosis not present

## 2018-02-05 ENCOUNTER — Encounter: Payer: Self-pay | Admitting: Gastroenterology

## 2018-02-09 ENCOUNTER — Encounter: Payer: Self-pay | Admitting: *Deleted

## 2018-02-09 ENCOUNTER — Telehealth: Payer: Self-pay | Admitting: Gastroenterology

## 2018-02-10 ENCOUNTER — Ambulatory Visit: Payer: Medicare HMO | Admitting: Gastroenterology

## 2018-02-10 ENCOUNTER — Telehealth: Payer: Self-pay | Admitting: Gastroenterology

## 2018-02-10 ENCOUNTER — Encounter: Payer: Self-pay | Admitting: Gastroenterology

## 2018-02-10 VITALS — BP 140/88 | HR 68 | Ht 67.75 in | Wt 159.6 lb

## 2018-02-10 DIAGNOSIS — Z1211 Encounter for screening for malignant neoplasm of colon: Secondary | ICD-10-CM | POA: Diagnosis not present

## 2018-02-10 DIAGNOSIS — K2270 Barrett's esophagus without dysplasia: Secondary | ICD-10-CM

## 2018-02-10 MED ORDER — SUPREP BOWEL PREP KIT 17.5-3.13-1.6 GM/177ML PO SOLN: ORAL | 0 refills | Status: DC

## 2018-02-10 MED ORDER — OMEPRAZOLE 20 MG PO CPDR: 20.0000 mg | DELAYED_RELEASE_CAPSULE | Freq: Every day | ORAL | 3 refills | Status: DC

## 2018-02-10 NOTE — Patient Instructions (Signed)
If you are age 78 or older, your body mass index should be between 23-30. Your Body mass index is 24.45 kg/m. If this is out of the aforementioned range listed, please consider follow up with your Primary Care Provider.  If you are age 65 or younger, your body mass index should be between 19-25. Your Body mass index is 24.45 kg/m. If this is out of the aformentioned range listed, please consider follow up with your Primary Care Provider.   You have been scheduled for an endoscopy and colonoscopy. Please follow the written instructions given to you at your visit today. Please pick up your prep supplies at the pharmacy within the next 1-3 days. If you use inhalers (even only as needed), please bring them with you on the day of your procedure. Your physician has requested that you go to www.startemmi.com and enter the access code given to you at your visit today. This web site gives a general overview about your procedure. However, you should still follow specific instructions given to you by our office regarding your preparation for the procedure.  We have sent the following medications to your pharmacy for you to pick up at your convenience: Omeprazole  We will request your labs from Dr. Versie Starks office.  Thank you for entrusting me with your care and for choosing [facility], Dr. [place] Cellar

## 2018-02-10 NOTE — Progress Notes (Signed)
HPI :  77 year old male with history of prostate cancer status post XRT, history of Barrett's esophagus and reflux coming here for follow-up visit.  He was diagnosed with a short segment of Barrett's esophagus without dysplasia in July 2016. He has been on omeprazole 20 mg once daily for this issue and his reflux symptoms. He states he is tolerating the medicine well. He denies any heartburn or reflux that bothers him at this time, he has no breakthrough symptoms. He denies any dysphagia or odynophagia. Postprandial abdominal pain. No nausea or vomiting. He states medications working quite well for him.  No FH of esophageal cancer.  His last colonoscopy was about 10 years ago.Other than hemorrhoids he had no allergy noted. He denies any blood in his stools. He denies any recent changes in his bowel such as constipation or diarrhea. He has no lower abdominal pains. He denies any family history of colon cancer. He does have a history of hemorrhoids which do not currently bother him too much.  EGD 04/20/2015 - irregular GEJ with islands up to 2cm from GEJ, biopsies with intestinal metaplasia without dysplasia, dilated esophagus Colonoscopy - 01/2008 - hemorrhoids, otherwise normal   Past Medical History:  Diagnosis Date  . Anxiety   . Barrett's esophagus without dysplasia dx 2016  . Colon polyps   . Depression   . ED (erectile dysfunction)   . H. pylori infection   . Hypertension   . Prostate cancer ([place])   . Pulmonary nodules   . Skin cancer    melanoma     Past Surgical History:  Procedure Laterality Date  . INSERTION PROSTATE RADIATION SEED  1997  . KNEE SURGERY Right   . MELANOMA EXCISION  11/2012   Family History  Problem Relation Age of Onset  . Breast cancer Mother   . Prostate cancer Maternal Grandfather   . Heart disease Father   . Colon cancer Neg Hx    Social History   Tobacco Use  . Smoking status: Never Smoker  . Smokeless tobacco: Never Used  Substance Use  Topics  . Alcohol use: Yes    Alcohol/week: 4.2 oz    Types: 7 Cans of beer per week  . Drug use: No   Current Outpatient Medications  Medication Sig Dispense Refill  . amLODipine (NORVASC) 5 MG tablet Take 5 mg by mouth daily.    Marland Kitchen omeprazole (PRILOSEC) 20 MG capsule Take 1 capsule (20 mg total) by mouth daily. 90 capsule 3  . sertraline (ZOLOFT) 100 MG tablet Take 100 mg by mouth daily.     No current facility-administered medications for this visit.    Allergies  Allergen Reactions  . Bee Venom Hives and Swelling     Review of Systems: All systems reviewed and negative except where noted in HPI.   No recent labs available  Physical Exam: BP 140/88   Pulse 68   Ht 5' 7.75" (1.721 m)   Wt 159 lb 9.6 oz (72.4 kg)   BMI 24.45 kg/m  Constitutional: Pleasant,well-developed, male in no acute distress. HEENT: Normocephalic and atraumatic. Conjunctivae are normal. No scleral icterus. Neck supple.  Cardiovascular: Normal rate, regular rhythm.  Pulmonary/chest: Effort normal and breath sounds normal. No wheezing, rales or rhonchi. Abdominal: Soft, nondistended, nontender. There are no masses palpable. No hepatomegaly. Extremities: no edema Lymphadenopathy: No cervical adenopathy noted. Neurological: Alert and oriented to person place and time. Skin: Skin is warm and dry. No rashes noted. Psychiatric: Normal mood and affect.  Behavior is normal.   ASSESSMENT AND PLAN: 77 year old male here for reassessment following issues:  Barrett's esophagus - short segment without dysplasia, tolerating omeprazole without any breakthrough symptoms or alarm symptoms. I discussed whether or not he wanted surveillance EGD for Barrett's esophagus. Following discussion of risks and benefits of endoscopy and anesthesia he wanted to proceed. Further recommendations pending the results. We will continue omeprazole the interim. Otherwise asked to obtain baseline labs including renal function from his  primary care's office to ensure stable on this regimen.  Colon cancer screening - asymptomatic. We discussed whether or not he wished to have any further colonoscopies at his age. He is otherwise in good health with a good life expectancy. After discussion of risks and benefits of colonoscopy, he wanted to proceed with 1 more colonoscopy. Further recommendations pending the results.  [place] Cellar, MD [facility] Gastroenterology

## 2018-02-10 NOTE — Telephone Encounter (Signed)
Labs from primary care arrived as follows:  BUN 17, Cr 0.9, GFR 82  LFTs normal  Will await results of EGD / colonsocopy

## 2018-02-25 ENCOUNTER — Telehealth: Payer: Self-pay | Admitting: Gastroenterology

## 2018-02-25 NOTE — Telephone Encounter (Signed)
Called Dr. Versie Starks office. Pt had CMP on 01-09-18.  They will fax that to [place] today.

## 2018-02-25 NOTE — Telephone Encounter (Signed)
Obtained labs from primary care, they had only A1c and no other labs to send. Jan can you let the patient know? He should have a recent BMET done if he has not had this done in the past year. Can you help order it? Thanks

## 2018-02-26 NOTE — Telephone Encounter (Signed)
rec'd additional labs (including CMP) from Dr. Versie Starks office.

## 2018-03-16 ENCOUNTER — Telehealth: Payer: Self-pay | Admitting: Gastroenterology

## 2018-03-17 ENCOUNTER — Other Ambulatory Visit: Payer: Self-pay

## 2018-03-17 MED ORDER — OMEPRAZOLE 20 MG PO CPDR: 20.0000 mg | DELAYED_RELEASE_CAPSULE | Freq: Every day | ORAL | 3 refills | Status: DC

## 2018-03-17 NOTE — Progress Notes (Signed)
Rx for Omeprazole sent to [facility] order pharmacy per pt request

## 2018-03-17 NOTE — Telephone Encounter (Signed)
RX sent

## 2018-03-20 ENCOUNTER — Encounter: Payer: Self-pay | Admitting: Gastroenterology

## 2018-04-02 ENCOUNTER — Ambulatory Visit (AMBULATORY_SURGERY_CENTER): Payer: Medicare HMO | Admitting: Gastroenterology

## 2018-04-02 ENCOUNTER — Other Ambulatory Visit: Payer: Self-pay

## 2018-04-02 ENCOUNTER — Encounter: Payer: Self-pay | Admitting: Gastroenterology

## 2018-04-02 VITALS — BP 117/72 | HR 61 | Temp 97.5°F | Resp 14 | Ht 67.7 in | Wt 159.0 lb

## 2018-04-02 DIAGNOSIS — K2270 Barrett's esophagus without dysplasia: Secondary | ICD-10-CM

## 2018-04-02 DIAGNOSIS — Z1211 Encounter for screening for malignant neoplasm of colon: Secondary | ICD-10-CM | POA: Diagnosis not present

## 2018-04-02 DIAGNOSIS — D123 Benign neoplasm of transverse colon: Secondary | ICD-10-CM | POA: Diagnosis not present

## 2018-04-02 DIAGNOSIS — D125 Benign neoplasm of sigmoid colon: Secondary | ICD-10-CM

## 2018-04-02 DIAGNOSIS — I10 Essential (primary) hypertension: Secondary | ICD-10-CM | POA: Diagnosis not present

## 2018-04-02 DIAGNOSIS — K219 Gastro-esophageal reflux disease without esophagitis: Secondary | ICD-10-CM | POA: Diagnosis not present

## 2018-04-02 DIAGNOSIS — K208 Other esophagitis: Secondary | ICD-10-CM | POA: Diagnosis not present

## 2018-04-02 DIAGNOSIS — K2950 Unspecified chronic gastritis without bleeding: Secondary | ICD-10-CM | POA: Diagnosis not present

## 2018-04-02 DIAGNOSIS — K635 Polyp of colon: Secondary | ICD-10-CM

## 2018-04-02 NOTE — Op Note (Signed)
[facility] Patient Name: Douglas Fritz Procedure Date: 04/02/2018 1:53 PM MRN: [id number] Endoscopist: Remo Lipps P. Havery Moros , MD Age: 77 Referring MD:  Date of Birth: [birth year] Gender: Male Account #: [id number] Procedure:                Colonoscopy Indications:              Screening for colorectal malignant neoplasm Medicines:                Monitored Anesthesia Care Procedure:                Pre-Anesthesia Assessment:                           - Prior to the procedure, a History and Physical                            was performed, and patient medications and                            allergies were reviewed. The patient's tolerance of                            previous anesthesia was also reviewed. The risks                            and benefits of the procedure and the sedation                            options and risks were discussed with the patient.                            All questions were answered, and informed consent                            was obtained. Prior Anticoagulants: The patient has                            taken no previous anticoagulant or antiplatelet                            agents. ASA Grade Assessment: II - A patient with                            mild systemic disease. After reviewing the risks                            and benefits, the patient was deemed in                            satisfactory condition to undergo the procedure.                           After obtaining informed consent, the colonoscope  was passed under direct vision. Throughout the                            procedure, the patient's blood pressure, pulse, and                            oxygen saturations were monitored continuously. The                            Colonoscope was introduced through the anus and                            advanced to the the cecum, identified by                            appendiceal orifice and  ileocecal valve. The                            colonoscopy was performed without difficulty. The                            patient tolerated the procedure well. The quality                            of the bowel preparation was good. The ileocecal                            valve, appendiceal orifice, and rectum were                            photographed. Scope In: 2:08:36 PM Scope Out: 2:33:49 PM Scope Withdrawal Time: 0 hours 16 minutes 28 seconds  Total Procedure Duration: 0 hours 25 minutes 13 seconds  Findings:                 Hemorrhoids were found on perianal exam.                           Four sessile polyps were found in the hepatic                            flexure. The polyps were 3 to 4 mm in size. These                            polyps were removed with a cold snare. Resection                            and retrieval were complete.                           Three sessile polyps were found in the sigmoid                            colon. The polyps were 3 to 5 mm in size. These  polyps were removed with a cold snare. Resection                            and retrieval were complete.                           Multiple medium-mouthed diverticula were found in                            the left colon and right colon.                           Internal hemorrhoids were found during                            retroflexion. The hemorrhoids were large.                           The exam was otherwise without abnormality. Complications:            No immediate complications. Estimated blood loss:                            Minimal. Estimated Blood Loss:     Estimated blood loss was minimal. Impression:               - Hemorrhoids found on perianal exam.                           - Four 3 to 4 mm polyps at the hepatic flexure,                            removed with a cold snare. Resected and retrieved.                           - Three 3 to 5 mm  polyps in the sigmoid colon,                            removed with a cold snare. Resected and retrieved.                           - Diverticulosis in the left colon and in the right                            colon.                           - Internal hemorrhoids.                           - The examination was otherwise normal. Recommendation:           - Patient has a contact number available for                            emergencies. The signs and symptoms of  potential                            delayed complications were discussed with the                            patient. Return to normal activities tomorrow.                            Written discharge instructions were provided to the                            patient.                           - Resume previous diet.                           - Continue present medications.                           - Await pathology results. Remo Lipps P. Armbruster, MD 04/02/2018 2:39:11 PM This report has been signed electronically.

## 2018-04-02 NOTE — Progress Notes (Signed)
Patient noted to have possible aflutter on heart monitor that was noted per anesthesia prior to starting this case when brought to the endo room. HR stable and BP stable, patient asymptomatic. Procedures were completed and he did well. Post procedure EKG obtained, showing suspected atrial fibrillation. HR in 60s with stable BP, patient asymptomatic. Unclear how longstanding this is. No recent EKG in our system. We called his primary care's office and they don't have any baseline EKGs on hand. They don't have any openings in their office to see him today. Options are going to the ED or urgent care now for further evaluation versus seeing PCP tomorrow if possible. Patient is completely asymptomatic - no chest pain, shortness of breath, palpitations, lightheadedness, etc. He would prefer to avoid ED visit but that is the only option if he wants to be seen now. He can call his PCPs office to discuss this issue further today, try to coordinate appointment with them tomorrow, or go to urgent care / ED for evaluation now. He agreed with the plan. Recommend he start daily aspirin otherwise until this issue has been addressed.

## 2018-04-02 NOTE — Progress Notes (Signed)
Report to PACU, RN, vss, BBS= Clear.  

## 2018-04-02 NOTE — Op Note (Signed)
[name]: Douglas Fritz Procedure Date: 04/02/2018 1:54 PM MRN: [id number] Endoscopist: Remo Lipps P. Havery Moros , MD Age: 77 Referring MD:  Date of Birth: 1941/07/30 Gender: Male Account #: [id number] Procedure:                Upper GI endoscopy Indications:              Surveillance for malignancy due to personal history                            of Barrett's esophagus Medicines:                Monitored Anesthesia Care Procedure:                Pre-Anesthesia Assessment:                           - Prior to the procedure, a History and Physical                            was performed, and patient medications and                            allergies were reviewed. The patient's tolerance of                            previous anesthesia was also reviewed. The risks                            and benefits of the procedure and the sedation                            options and risks were discussed with the patient.                            All questions were answered, and informed consent                            was obtained. Prior Anticoagulants: The patient has                            taken no previous anticoagulant or antiplatelet                            agents. ASA Grade Assessment: II - A patient with                            mild systemic disease. After reviewing the risks                            and benefits, the patient was deemed in                            satisfactory condition to undergo the procedure.  After obtaining informed consent, the endoscope was                            passed under direct vision. Throughout the                            procedure, the patient's blood pressure, pulse, and                            oxygen saturations were monitored continuously. The                            Endoscope was introduced through the mouth, and                            advanced to the second part  of duodenum. The upper                            GI endoscopy was accomplished without difficulty.                            The patient tolerated the procedure well. Scope In: Scope Out: Findings:                 Esophagogastric landmarks were identified: the                            Z-line was found at 40 cm, the gastroesophageal                            junction was found at 40 cm and the upper extent of                            the gastric folds was found at 40 cm from the                            incisors.                           The Z-line was irregular with a few small                            extensions of salmon colored mucosa and diminutive                            islands of salmon colored mucosa. Biopsies were                            taken with a cold forceps for histology.                           The exam of the esophagus was otherwise normal.  Patchy mild inflammation characterized by erythema                            and granularity was found in the gastric fundus and                            in the gastric body. Biopsies were taken with a                            cold forceps for Helicobacter pylori testing.                           The exam of the stomach was otherwise normal.                           The duodenal bulb and second portion of the                            duodenum were normal. Complications:            No immediate complications. Estimated blood loss:                            Minimal. Estimated Blood Loss:     Estimated blood loss was minimal. Impression:               - Esophagogastric landmarks identified.                           - Z-line irregular. Biopsied.                           - Normal esophagus otherwise                           - Mild gastritis. Biopsied.                           - Normal duodenal bulb and second portion of the                            duodenum. Recommendation:            - Patient has a contact number available for                            emergencies. The signs and symptoms of potential                            delayed complications were discussed with the                            patient. Return to normal activities tomorrow.                            Written discharge instructions were provided to the  patient.                           - Resume previous diet.                           - Continue present medications.                           - Await pathology results. Remo Lipps P. Armbruster, MD 04/02/2018 2:43:14 PM This report has been signed electronically.

## 2018-04-02 NOTE — Progress Notes (Signed)
Patient shows atrial flutter on monitor upon arrival to recovery. HR consistently in the mid 60's and regular, BP 177/72, Patient asymptomatic, denies CP or SOB. MD notified. 12 lead ECG performed which reads atrial fibrillation, HR 63. Attempted to reach Dr. Versie [facility] office multiple times to schedule an appointment for evaluation of abnormal ECG, told his doctor has left for the day, and no other providers can see him today. Called back multiple times to see if they could see him tomorrow and was put on hold then disconnected. Summer (triage nurse) returned my call and said they couldn't see him tomorrow and the on call physician says to tell him to report to the ER. Dr. Havery Moros has seen and spoken with patient at length during their stay in recovery, patient remains stable, BP stable, HR remains in the 60's and regular, patient without complaints. Dr. Havery Moros recommends patient call Dr. Versie Starks office today to speak with them directly regarding evaluation with [facility]. Patient instructed to call 911 immediately if he experiences CP, SOB, or any changes prior to following up with his PCP. Patient and his wife verbalize understanding.

## 2018-04-02 NOTE — Patient Instructions (Addendum)
YOU HAD AN ENDOSCOPIC PROCEDURE TODAY AT [facility]:   Refer to the procedure report that was given to you for any specific questions about what was found during the examination.  If the procedure report does not answer your questions, please call your gastroenterologist to clarify.  If you requested that your care partner not be given the details of your procedure findings, then the procedure report has been included in a sealed envelope for you to review at your convenience later.  YOU SHOULD EXPECT: Some feelings of bloating in the abdomen. Passage of more gas than usual.  Walking can help get rid of the air that was put into your GI tract during the procedure and reduce the bloating. If you had a lower endoscopy (such as a colonoscopy or flexible sigmoidoscopy) you may notice spotting of blood in your stool or on the toilet paper. If you underwent a bowel prep for your procedure, you may not have a normal bowel movement for a few days.  Please Note:  You might notice some irritation and congestion in your nose or some drainage.  This is from the oxygen used during your procedure.  There is no need for concern and it should clear up in a day or so.  SYMPTOMS TO REPORT IMMEDIATELY:   Following lower endoscopy (colonoscopy or flexible sigmoidoscopy):  Excessive amounts of blood in the stool  Significant tenderness or worsening of abdominal pains  Swelling of the abdomen that is new, acute  Fever of 100F or higher   Following upper endoscopy (EGD)  Vomiting of blood or coffee ground material  New chest pain or pain under the shoulder blades  Painful or persistently difficult swallowing  New shortness of breath  Fever of 100F or higher  Black, tarry-looking stools  For urgent or emergent issues, a gastroenterologist can be reached at any hour by calling [phone].   DIET:  We do recommend a small meal at first, but then you may proceed to your regular diet.  Drink  plenty of fluids but you should avoid alcoholic beverages for 24 hours.  MEDICATIONS: Continue present medications.  Please see handouts given to you by your recovery nurse.  Follow up: Pt. to follow up with his primary care physician, Dr. Versie Starks office, for evaluation of new onset atrial flutter/fibrillation seen on 12 lead ECG. Copy of ECG sent with patient. Patient given instructions to report to the emergency room if he becomes symptomatic, such has having chest pain, shortness of breath, lethargy, or altered mental status.   ACTIVITY:  You should plan to take it easy for the rest of today and you should NOT DRIVE or use heavy machinery until tomorrow (because of the sedation medicines used during the test).    FOLLOW UP: Our staff will call the number listed on your records the next business day following your procedure to check on you and address any questions or concerns that you may have regarding the information given to you following your procedure. If we do not reach you, we will leave a message.  However, if you are feeling well and you are not experiencing any problems, there is no need to return our call.  We will assume that you have returned to your regular daily activities without incident.  If any biopsies were taken you will be contacted by phone or by letter within the next 1-3 weeks.  Please call us at [phone] if you have not heard about the  biopsies in 3 weeks.   Thank you for allowing [place] to provide for your healthcare needs today.  SIGNATURES/CONFIDENTIALITY: You and/or your care partner have signed paperwork which will be entered into your electronic medical record.  These signatures attest to the fact that that the information above on your After Visit Summary has been reviewed and is understood.  Full responsibility of the confidentiality of this discharge information lies with you and/or your care-partner.

## 2018-04-02 NOTE — Progress Notes (Signed)
Called to room to assist during endoscopic procedure.  Patient ID and intended procedure confirmed with present staff. Received instructions for my participation in the procedure from the performing physician.  

## 2018-04-03 ENCOUNTER — Telehealth: Payer: Self-pay

## 2018-04-03 DIAGNOSIS — I4891 Unspecified atrial fibrillation: Secondary | ICD-10-CM | POA: Diagnosis not present

## 2018-04-03 NOTE — Telephone Encounter (Signed)
  Follow up Call-  Call back number 04/02/2018  Post procedure Call Back phone  # [phone]  Permission to leave phone message Yes  Some recent data might be hidden     Patient questions:  Do you have a fever, pain , or abdominal swelling? No. Pain Score  0 *  Have you tolerated food without any problems? Yes.    Have you been able to return to your normal activities? Yes.    Do you have any questions about your discharge instructions: Diet   No. Medications  No. Follow up visit  No.  Do you have questions or concerns about your Care? No.  Actions: * If pain score is 4 or above: No action needed, pain <4.

## 2018-04-03 NOTE — Telephone Encounter (Signed)
Left message, will call back later today, B.Harvie Morua RN 

## 2018-04-10 DIAGNOSIS — I10 Essential (primary) hypertension: Secondary | ICD-10-CM | POA: Diagnosis not present

## 2018-04-10 DIAGNOSIS — I4891 Unspecified atrial fibrillation: Secondary | ICD-10-CM | POA: Diagnosis not present

## 2018-04-20 DIAGNOSIS — I340 Nonrheumatic mitral (valve) insufficiency: Secondary | ICD-10-CM | POA: Diagnosis not present

## 2018-04-20 DIAGNOSIS — I10 Essential (primary) hypertension: Secondary | ICD-10-CM | POA: Diagnosis not present

## 2018-04-20 DIAGNOSIS — I4891 Unspecified atrial fibrillation: Secondary | ICD-10-CM | POA: Diagnosis not present

## 2018-04-20 DIAGNOSIS — R0609 Other forms of dyspnea: Secondary | ICD-10-CM | POA: Diagnosis not present

## 2018-04-30 DIAGNOSIS — I340 Nonrheumatic mitral (valve) insufficiency: Secondary | ICD-10-CM | POA: Diagnosis not present

## 2018-04-30 DIAGNOSIS — R0602 Shortness of breath: Secondary | ICD-10-CM | POA: Diagnosis not present

## 2018-05-04 DIAGNOSIS — I4891 Unspecified atrial fibrillation: Secondary | ICD-10-CM | POA: Diagnosis not present

## 2018-05-13 DIAGNOSIS — R0609 Other forms of dyspnea: Secondary | ICD-10-CM | POA: Diagnosis not present

## 2018-05-13 DIAGNOSIS — I340 Nonrheumatic mitral (valve) insufficiency: Secondary | ICD-10-CM | POA: Diagnosis not present

## 2018-05-13 DIAGNOSIS — I4891 Unspecified atrial fibrillation: Secondary | ICD-10-CM | POA: Diagnosis not present

## 2018-05-13 DIAGNOSIS — I10 Essential (primary) hypertension: Secondary | ICD-10-CM | POA: Diagnosis not present

## 2018-05-19 DIAGNOSIS — R0609 Other forms of dyspnea: Secondary | ICD-10-CM | POA: Diagnosis not present

## 2018-05-21 NOTE — H&P (Signed)
OFFICE VISIT NOTES COPIED TO EPIC FOR DOCUMENTATION  History of Present Illness Laverda Page MD; 05/14/2018 9:32 PM) Patient words: Last O/V 04/20/2018; 3 Week f/u for Echo & Lab results.  The patient is a 77 year old male who presents for a Follow-up for Atrial fibrillation.  Additional reasons for visit:  Follow-up for Mitral regurgitation is described as the following: Mr. Correy Weidner is a Caucasian male with history of hypertension, prostate cancer, Barrett's esophagus, referred to [place] for evaluation of new onset atrial fibrillation that was noted during colonoscopy/endoscopy on 04/05/18 and seen next day by PCP and was started on Apixaban and metoprolol.  Patient was symptomatic prior to diagnosis, admitting to some occasional fatigue and shortness of breath on exertion and also leg edema. Over the past 3 weeks he has started to notice acute onset dyspnea and reduced exercise tolerence. Underwent echocardiogram and presents for f/u. On his last OV due to fatigue, I had reduced Metoprolol dose and added ARB, he has noticed improved fatigue and dyspnea some. Wife present.   Problem List/Past Medical Frances Furbish Johnson; May 21, 2018 12:15 PM) Dyspepsia (R10.13)  History of colon polyps (Z86.010)  Acid reflux (K21.9)  Depression (F32.9)  Barrett's esophagus (K22.70)  Benign essential hypertension (I10)  Laboratory examination (Z01.89)  05/05/2018: Creatinine 1.06, EGFR 67/78, potassium 4.7, BMP normal. 04/03/2018: CBC normal. Creatinine 0.96, EGFR 76/92, potassium 4.4, CMP normal. TSH 2.75. Hemoglobin A1c 6.1%. Cholesterol 161, triglycerides 52, HDL 49, LDL 102 Atrial fibrillation, controlled (I48.91)  CHA2DS2-VASc Score is 3 with yearly risk of stroke of 3.2 %. EKG 04/20/2018: Atrial fibrillation with controlled ventricular response at the rate of 60 bpm, normal axis. Incomplete right bundle branch block. Poor R-wave progression, cannot exclude anterior infarct old. Lateral T-wave  inversion, cannot exclude ischemia versus LVH with repolarization abnormality. Abnormal EKG. Mild hyperlipidemia (E78.5)  Dyspnea on exertion (R06.09)  Echocardiogram 04/30/2018: Left ventricle cavity is normal in size. Mild concentric hypertrophy of the left ventricle. Normal global wall motion. Unable to evaluate diastolic function due to atrial fibrillation and severity of mitral regurgitation. Systolic and diastolic septal flatterning, suggestive of right sided pressure and volume overload. Calculated EF 55%. Left atrial cavity is severely dilated. Right atrial cavity is severely dilated. Moderate to severe mitral regurgitation. Moderate tricuspid regurgitation. Moderate pulmonary hypertension. Estimated pulmonary artery systolic pressure 50 mmHg. IVC is dilated with a respiratory response of <50%. Suggests elevated central venous pressure. Mitral regurgitation (I34.0)   Allergies Frances Furbish Johnson; 2018-05-21 12:15 PM) No Known Drug Allergies [04/20/2018]:  Family History Cheri Kearns; 05/21/18 12:15 PM) Mother  Deceased. At age 88; breast cancer, No heart issues Father  Deceased. at age 23; heart disease  Social History Cheri Kearns; 05/21/2018 12:15 PM) Current tobacco use  Never smoker. Alcohol Use  Occasional alcohol use. Marital status  Married. Living Situation  Lives with spouse. Number of Children  3.  Past Surgical History Frances Furbish Wynetta Emery; 05/21/2018 12:15 PM) Insertion prostate radiation seed [1997]: Knee Replacement, Total  Right. Melanoma excision [11/2012]:  Medication History Frances Furbish Wynetta Emery; May 21, 2018 12:19 PM) Metoprolol Tartrate ('25MG'$  Tablet,  Tablet Oral two times daily, Taken starting 04/20/2018) Active. (Reduced dose due to fatigue and insomnia) Atorvastatin Calcium ('10MG'$  Tablet, 1 (one) Tablet Oral daily, Taken starting 04/20/2018) Active. Valsartan-hydroCHLOROthiazide (80-12.'5MG'$  Tablet, 1 (one) Tablet Oral every morning, Taken starting  04/20/2018) Active. Sertraline HCl ('100MG'$  Tablet, 1 Oral daily) Active. Omeprazole ('20MG'$  Capsule DR, 1 Oral daily) Active. Eliquis ('5MG'$  Tablet, 1 Oral two times daily) Active. Medications Reconciled (verbally)  Diagnostic Studies History (April Garrison; Jun 05, 2018 8:54 AM) Echocardiogram  04/30/2018: Left ventricle cavity is normal in size. Mild concentric hypertrophy of the left ventricle. Normal global wall motion. Unable to evaluate diastolic function due to atrial fibrillation and severity of mitral regurgitation. Systolic and diastolic septal flatterning, suggestive of right sided pressure and volume overload. Calculated EF 55%. Left atrial cavity is severely dilated. Right atrial cavity is severely dilated. Moderate to severe mitral regurgitation. Moderate tricuspid regurgitation. Moderate pulmonary hypertension. Estimated pulmonary artery systolic pressure 50 mmHg. IVC is dilated with a respiratory response of <50%. Suggests elevated central venous pressure.    Review of Systems Laverda Page MD; 05/14/2018 9:32 PM) General Present- Fatigue (improved since last OV). Not Present- Appetite Loss and Weight Gain. Respiratory Not Present- Chronic Cough and Wakes up from Sleep Wheezing or Short of Breath. Cardiovascular Present- Difficulty Breathing On Exertion and Edema. Not Present- Chest Pain and Difficulty Breathing Lying Down. Gastrointestinal Not Present- Black, Tarry Stool, Bloody Stool and Difficulty Swallowing. Musculoskeletal Not Present- Decreased Range of Motion and Muscle Atrophy. Neurological Not Present- Attention Deficit. Psychiatric Present- Insomnia (Since starting metoprolol, improved with decreasing the dose). Not Present- Personality Changes and Suicidal Ideation. Endocrine Not Present- Cold Intolerance and Heat Intolerance. Hematology Not Present- Abnormal Bleeding. All other systems negative  Vitals Frances Furbish Johnson; 06-05-2018 12:20 PM) 05-Jun-2018 12:17  PM Weight: 158.56 lb Height: 68in Body Surface Area: 1.85 m Body Mass Index: 24.11 kg/m  Pulse: 68 (Regular)  P.OX: 98% (Room air) BP: 142/78 (Sitting, Left Arm, Standard)       Physical Exam Laverda Page MD; 05/14/2018 9:33 PM) General Mental Status-Alert. General Appearance-Cooperative and Appears younger than stated age. Build & Nutrition-Well built and Well nourished.  Head and Neck Thyroid Gland Characteristics - normal size and consistency and no palpable nodules.  Chest and Lung Exam Chest and lung exam reveals -quiet, even and easy respiratory effort with no use of accessory muscles, non-tender and on auscultation, normal breath sounds, no adventitious sounds.  Cardiovascular Cardiovascular examination reveals -carotid auscultation reveals no bruits, abdominal aorta auscultation reveals no bruits and no prominent pulsation, femoral artery auscultation bilaterally reveals normal pulses, no bruits, no thrills and normal pedal pulses bilaterally. Auscultation Rhythm - Irregularly irregular and Bradycardic. Murmurs & Other Heart Sounds: Murmur - Location - Mitral Area. Timing - Holosystolic. Grade - III/VI. Radiation - Left axilla.  Abdomen Palpation/Percussion Normal exam - Non Tender and No hepatosplenomegaly.  Peripheral Vascular Lower Extremity Palpation - Edema - Bilateral - 1+ Pitting edema.  Neurologic Neurologic evaluation reveals -alert and oriented x 3 with no impairment of recent or remote memory. Motor-Grossly intact without any focal deficits.  Musculoskeletal Global Assessment Left Lower Extremity - no deformities, masses or tenderness, no known fractures. Right Lower Extremity - no deformities, masses or tenderness, no known fractures.    Assessment & Plan Laverda Page MD; 05/14/2018 9:36 PM)  Mitral regurgitation.  Atrial fibrillation, controlled (I48.91) Story: CHA2DS2-VASc Score is 3 with yearly risk of  stroke of 3.2 %.  EKG 04/20/2018: Atrial fibrillation with controlled ventricular response at the rate of 60 bpm, normal axis. Incomplete right bundle branch block. Poor R-wave progression, cannot exclude anterior infarct old. Lateral T-wave inversion, cannot exclude ischemia versus LVH with repolarization abnormality. Abnormal EKG. Severe mitral regurgitation (I34.0) Story: Echocardiogram 04/30/2018: Left ventricle cavity is normal in size. Mild concentric hypertrophy of the left ventricle. Normal global wall motion. Unable to evaluate diastolic function due to atrial fibrillation and severity  of mitral regurgitation. Systolic and diastolic septal flatterning, suggestive of right sided pressure and volume overload. Calculated EF 55%. Left atrial cavity is severely dilated. Right atrial cavity is severely dilated. Moderate to severe mitral regurgitation. Moderate tricuspid regurgitation. Moderate pulmonary hypertension. Estimated pulmonary artery systolic pressure 50 mmHg. IVC is dilated with a respiratory response of <50%. Suggests elevated central venous pressure. Current Plans Changed Valsartan-hydroCHLOROthiazide 160-12.'5MG'$ , 1 (one) Tablet every morning, #30, 30 days starting 05/13/2018, Ref. x2. Dyspnea on exertion (R06.09) Current Plans METABOLIC PANEL, BASIC (00762) CBC & PLATELETS (AUTO) (26333) Benign essential hypertension (I10) Mild hyperlipidemia (E78.5) Laboratory examination (L45.62) Story: 05/05/2018: Creatinine 1.06, EGFR 67/78, potassium 4.7, BMP normal.  04/03/2018: CBC normal. Creatinine 0.96, EGFR 76/92, potassium 4.4, CMP normal. TSH 2.75. Hemoglobin A1c 6.1%. Cholesterol 161, triglycerides 52, HDL 49, LDL 102  Note:. Recommendation:  Mr. Khylin Gutridge is a Caucasian male, Originally from [place], with history of hypertension, prostate cancer, Barrett's esophagus, referred to [place] for evaluation of new onset atrial fibrillation that was noted during colonoscopy/endoscopy on  04/05/18.  Over the past 3 weeks he has started to notice acute onset dyspnea and reduced exercise tolerence. Underwent echocardiogram and presents for f/u. On his last OV due to fatigue, I had reduced Metoprolol dose and added ARB, he has noticed improved fatigue and dyspnea some. Had also started him on Lipitor. Wife present.  He has moderately severe MR on echocardiogram and severe MR on physical exam, clearly his exercise tolerance has reduced. He is feeling much better compared to previous office visit with me 4 weeks ago, will increase valsartan HCT from 80/12.5 mg to 160/12.5 mg daily as systolic blood pressure is still high and also to reduce afterload.  I have reviewed the results of the labs and also echocardiogram the patient, he will need TEE to better evaluate the mitral valve. He probably will be a good candidate for mitral valve repair and probably maze procedure.  Continue anticoagulation for now. I will also set him up for left and right heart catheterization depending on severity of MR after the TEE. A. Fib management will depend upon whether he needs surgery.  He is now tolerating Lipitor for mild hyperlipidemia continue the same. Office visit after the tests. This was a greater than 40 minute office visit with greater than 50% of the time spent with face-to-face encounter with patient and his wife, answering CHF, Valvular heart disease and management and discussion regarding TEE and coronary angiogram. Schedule for cardiac catheterization, and possible angioplasty. We discussed regarding risks, benefits, alternatives to this including stress testing, CTA and continued medical therapy. Patient wants to proceed. Understands <1-2% risk of death, stroke, MI, urgent CABG, bleeding, infection, renal failure but not limited to these.  CC: Dr. Lujean Amel (PCP)    Signed by Laverda Page, MD (05/14/2018 9:36 PM)

## 2018-05-21 NOTE — H&P (View-Only) (Signed)
OFFICE VISIT NOTES COPIED TO EPIC FOR DOCUMENTATION  History of Present Illness Laverda Page MD; 05/14/2018 9:32 PM) Patient words: Last O/V 04/20/2018; 3 Week f/u for Echo & Lab results.  The patient is a 77 year old male who presents for a Follow-up for Atrial fibrillation.  Additional reasons for visit:  Follow-up for Mitral regurgitation is described as the following: Mr. Janathan Bribiesca is a Caucasian male with history of hypertension, prostate cancer, Barrett's esophagus, referred to [place] for evaluation of new onset atrial fibrillation that was noted during colonoscopy/endoscopy on 04/05/18 and seen next day by PCP and was started on Apixaban and metoprolol.  Patient was symptomatic prior to diagnosis, admitting to some occasional fatigue and shortness of breath on exertion and also leg edema. Over the past 3 weeks he has started to notice acute onset dyspnea and reduced exercise tolerence. Underwent echocardiogram and presents for f/u. On his last OV due to fatigue, I had reduced Metoprolol dose and added ARB, he has noticed improved fatigue and dyspnea some. Wife present.   Problem List/Past Medical Frances Furbish Johnson; 02-Jun-2018 12:15 PM) Dyspepsia (R10.13)  History of colon polyps (Z86.010)  Acid reflux (K21.9)  Depression (F32.9)  Barrett's esophagus (K22.70)  Benign essential hypertension (I10)  Laboratory examination (Z01.89)  05/05/2018: Creatinine 1.06, EGFR 67/78, potassium 4.7, BMP normal. 04/03/2018: CBC normal. Creatinine 0.96, EGFR 76/92, potassium 4.4, CMP normal. TSH 2.75. Hemoglobin A1c 6.1%. Cholesterol 161, triglycerides 52, HDL 49, LDL 102 Atrial fibrillation, controlled (I48.91)  CHA2DS2-VASc Score is 3 with yearly risk of stroke of 3.2 %. EKG 04/20/2018: Atrial fibrillation with controlled ventricular response at the rate of 60 bpm, normal axis. Incomplete right bundle branch block. Poor R-wave progression, cannot exclude anterior infarct old. Lateral T-wave  inversion, cannot exclude ischemia versus LVH with repolarization abnormality. Abnormal EKG. Mild hyperlipidemia (E78.5)  Dyspnea on exertion (R06.09)  Echocardiogram 04/30/2018: Left ventricle cavity is normal in size. Mild concentric hypertrophy of the left ventricle. Normal global wall motion. Unable to evaluate diastolic function due to atrial fibrillation and severity of mitral regurgitation. Systolic and diastolic septal flatterning, suggestive of right sided pressure and volume overload. Calculated EF 55%. Left atrial cavity is severely dilated. Right atrial cavity is severely dilated. Moderate to severe mitral regurgitation. Moderate tricuspid regurgitation. Moderate pulmonary hypertension. Estimated pulmonary artery systolic pressure 50 mmHg. IVC is dilated with a respiratory response of <50%. Suggests elevated central venous pressure. Mitral regurgitation (I34.0)   Allergies Frances Furbish Johnson; 2018-06-02 12:15 PM) No Known Drug Allergies [04/20/2018]:  Family History Cheri Kearns; 2018-06-02 12:15 PM) Mother  Deceased. At age 46; breast cancer, No heart issues Father  Deceased. at age 60; heart disease  Social History Cheri Kearns; 02-Jun-2018 12:15 PM) Current tobacco use  Never smoker. Alcohol Use  Occasional alcohol use. Marital status  Married. Living Situation  Lives with spouse. Number of Children  3.  Past Surgical History Frances Furbish Wynetta Emery; 06/02/2018 12:15 PM) Insertion prostate radiation seed [1997]: Knee Replacement, Total  Right. Melanoma excision [11/2012]:  Medication History Frances Furbish Wynetta Emery; 2018/06/02 12:19 PM) Metoprolol Tartrate ('25MG'$  Tablet,  Tablet Oral two times daily, Taken starting 04/20/2018) Active. (Reduced dose due to fatigue and insomnia) Atorvastatin Calcium ('10MG'$  Tablet, 1 (one) Tablet Oral daily, Taken starting 04/20/2018) Active. Valsartan-hydroCHLOROthiazide (80-12.'5MG'$  Tablet, 1 (one) Tablet Oral every morning, Taken starting  04/20/2018) Active. Sertraline HCl ('100MG'$  Tablet, 1 Oral daily) Active. Omeprazole ('20MG'$  Capsule DR, 1 Oral daily) Active. Eliquis ('5MG'$  Tablet, 1 Oral two times daily) Active. Medications Reconciled (verbally)  Diagnostic Studies History (April Garrison; Jun 12, 2018 8:54 AM) Echocardiogram  04/30/2018: Left ventricle cavity is normal in size. Mild concentric hypertrophy of the left ventricle. Normal global wall motion. Unable to evaluate diastolic function due to atrial fibrillation and severity of mitral regurgitation. Systolic and diastolic septal flatterning, suggestive of right sided pressure and volume overload. Calculated EF 55%. Left atrial cavity is severely dilated. Right atrial cavity is severely dilated. Moderate to severe mitral regurgitation. Moderate tricuspid regurgitation. Moderate pulmonary hypertension. Estimated pulmonary artery systolic pressure 50 mmHg. IVC is dilated with a respiratory response of <50%. Suggests elevated central venous pressure.    Review of Systems Laverda Page MD; 05/14/2018 9:32 PM) General Present- Fatigue (improved since last OV). Not Present- Appetite Loss and Weight Gain. Respiratory Not Present- Chronic Cough and Wakes up from Sleep Wheezing or Short of Breath. Cardiovascular Present- Difficulty Breathing On Exertion and Edema. Not Present- Chest Pain and Difficulty Breathing Lying Down. Gastrointestinal Not Present- Black, Tarry Stool, Bloody Stool and Difficulty Swallowing. Musculoskeletal Not Present- Decreased Range of Motion and Muscle Atrophy. Neurological Not Present- Attention Deficit. Psychiatric Present- Insomnia (Since starting metoprolol, improved with decreasing the dose). Not Present- Personality Changes and Suicidal Ideation. Endocrine Not Present- Cold Intolerance and Heat Intolerance. Hematology Not Present- Abnormal Bleeding. All other systems negative  Vitals Frances Furbish Johnson; 2018-06-12 12:20 PM) 2018/06/12 12:17  PM Weight: 158.56 lb Height: 68in Body Surface Area: 1.85 m Body Mass Index: 24.11 kg/m  Pulse: 68 (Regular)  P.OX: 98% (Room air) BP: 142/78 (Sitting, Left Arm, Standard)       Physical Exam Laverda Page MD; 05/14/2018 9:33 PM) General Mental Status-Alert. General Appearance-Cooperative and Appears younger than stated age. Build & Nutrition-Well built and Well nourished.  Head and Neck Thyroid Gland Characteristics - normal size and consistency and no palpable nodules.  Chest and Lung Exam Chest and lung exam reveals -quiet, even and easy respiratory effort with no use of accessory muscles, non-tender and on auscultation, normal breath sounds, no adventitious sounds.  Cardiovascular Cardiovascular examination reveals -carotid auscultation reveals no bruits, abdominal aorta auscultation reveals no bruits and no prominent pulsation, femoral artery auscultation bilaterally reveals normal pulses, no bruits, no thrills and normal pedal pulses bilaterally. Auscultation Rhythm - Irregularly irregular and Bradycardic. Murmurs & Other Heart Sounds: Murmur - Location - Mitral Area. Timing - Holosystolic. Grade - III/VI. Radiation - Left axilla.  Abdomen Palpation/Percussion Normal exam - Non Tender and No hepatosplenomegaly.  Peripheral Vascular Lower Extremity Palpation - Edema - Bilateral - 1+ Pitting edema.  Neurologic Neurologic evaluation reveals -alert and oriented x 3 with no impairment of recent or remote memory. Motor-Grossly intact without any focal deficits.  Musculoskeletal Global Assessment Left Lower Extremity - no deformities, masses or tenderness, no known fractures. Right Lower Extremity - no deformities, masses or tenderness, no known fractures.    Assessment & Plan Laverda Page MD; 05/14/2018 9:36 PM)  Mitral regurgitation.  Atrial fibrillation, controlled (I48.91) Story: CHA2DS2-VASc Score is 3 with yearly risk of  stroke of 3.2 %.  EKG 04/20/2018: Atrial fibrillation with controlled ventricular response at the rate of 60 bpm, normal axis. Incomplete right bundle branch block. Poor R-wave progression, cannot exclude anterior infarct old. Lateral T-wave inversion, cannot exclude ischemia versus LVH with repolarization abnormality. Abnormal EKG. Severe mitral regurgitation (I34.0) Story: Echocardiogram 04/30/2018: Left ventricle cavity is normal in size. Mild concentric hypertrophy of the left ventricle. Normal global wall motion. Unable to evaluate diastolic function due to atrial fibrillation and severity  of mitral regurgitation. Systolic and diastolic septal flatterning, suggestive of right sided pressure and volume overload. Calculated EF 55%. Left atrial cavity is severely dilated. Right atrial cavity is severely dilated. Moderate to severe mitral regurgitation. Moderate tricuspid regurgitation. Moderate pulmonary hypertension. Estimated pulmonary artery systolic pressure 50 mmHg. IVC is dilated with a respiratory response of <50%. Suggests elevated central venous pressure. Current Plans Changed Valsartan-hydroCHLOROthiazide 160-12.'5MG'$ , 1 (one) Tablet every morning, #30, 30 days starting 05/13/2018, Ref. x2. Dyspnea on exertion (R06.09) Current Plans METABOLIC PANEL, BASIC (75797) CBC & PLATELETS (AUTO) (28206) Benign essential hypertension (I10) Mild hyperlipidemia (E78.5) Laboratory examination (O15.61) Story: 05/05/2018: Creatinine 1.06, EGFR 67/78, potassium 4.7, BMP normal.  04/03/2018: CBC normal. Creatinine 0.96, EGFR 76/92, potassium 4.4, CMP normal. TSH 2.75. Hemoglobin A1c 6.1%. Cholesterol 161, triglycerides 52, HDL 49, LDL 102  Note:. Recommendation:  Mr. Doniel Maiello is a Caucasian male, Originally from [place], with history of hypertension, prostate cancer, Barrett's esophagus, referred to [place] for evaluation of new onset atrial fibrillation that was noted during colonoscopy/endoscopy on  04/05/18.  Over the past 3 weeks he has started to notice acute onset dyspnea and reduced exercise tolerence. Underwent echocardiogram and presents for f/u. On his last OV due to fatigue, I had reduced Metoprolol dose and added ARB, he has noticed improved fatigue and dyspnea some. Had also started him on Lipitor. Wife present.  He has moderately severe MR on echocardiogram and severe MR on physical exam, clearly his exercise tolerance has reduced. He is feeling much better compared to previous office visit with me 4 weeks ago, will increase valsartan HCT from 80/12.5 mg to 160/12.5 mg daily as systolic blood pressure is still high and also to reduce afterload.  I have reviewed the results of the labs and also echocardiogram the patient, he will need TEE to better evaluate the mitral valve. He probably will be a good candidate for mitral valve repair and probably maze procedure.  Continue anticoagulation for now. I will also set him up for left and right heart catheterization depending on severity of MR after the TEE. A. Fib management will depend upon whether he needs surgery.  He is now tolerating Lipitor for mild hyperlipidemia continue the same. Office visit after the tests. This was a greater than 40 minute office visit with greater than 50% of the time spent with face-to-face encounter with patient and his wife, answering CHF, Valvular heart disease and management and discussion regarding TEE and coronary angiogram. Schedule for cardiac catheterization, and possible angioplasty. We discussed regarding risks, benefits, alternatives to this including stress testing, CTA and continued medical therapy. Patient wants to proceed. Understands <1-2% risk of death, stroke, MI, urgent CABG, bleeding, infection, renal failure but not limited to these.  CC: Dr. Lujean Amel (PCP)    Signed by Laverda Page, MD (05/14/2018 9:36 PM)

## 2018-05-22 ENCOUNTER — Encounter (HOSPITAL_COMMUNITY): Payer: Self-pay | Admitting: *Deleted

## 2018-05-22 ENCOUNTER — Encounter (HOSPITAL_COMMUNITY)
Admission: RE | Disposition: A | Discharge status: Home / Self Care | Payer: Self-pay | Source: Ambulatory Visit | Attending: Cardiology

## 2018-05-22 ENCOUNTER — Ambulatory Visit (HOSPITAL_COMMUNITY): Payer: Medicare HMO | Attending: Cardiology

## 2018-05-22 ENCOUNTER — Ambulatory Visit (HOSPITAL_COMMUNITY)
Admission: RE | Admit: 2018-05-22 | Discharge: 2018-05-22 | Disposition: A | Discharge status: Home / Self Care | Payer: Medicare HMO | Source: Ambulatory Visit | Attending: Cardiology | Admitting: Cardiology

## 2018-05-22 DIAGNOSIS — I4891 Unspecified atrial fibrillation: Secondary | ICD-10-CM | POA: Insufficient documentation

## 2018-05-22 DIAGNOSIS — I340 Nonrheumatic mitral (valve) insufficiency: Secondary | ICD-10-CM | POA: Diagnosis not present

## 2018-05-22 DIAGNOSIS — Z7901 Long term (current) use of anticoagulants: Secondary | ICD-10-CM | POA: Insufficient documentation

## 2018-05-22 DIAGNOSIS — K219 Gastro-esophageal reflux disease without esophagitis: Secondary | ICD-10-CM | POA: Diagnosis not present

## 2018-05-22 DIAGNOSIS — E785 Hyperlipidemia, unspecified: Secondary | ICD-10-CM | POA: Insufficient documentation

## 2018-05-22 DIAGNOSIS — I10 Essential (primary) hypertension: Secondary | ICD-10-CM | POA: Insufficient documentation

## 2018-05-22 DIAGNOSIS — F329 Major depressive disorder, single episode, unspecified: Secondary | ICD-10-CM | POA: Insufficient documentation

## 2018-05-22 DIAGNOSIS — R69 Illness, unspecified: Secondary | ICD-10-CM | POA: Diagnosis not present

## 2018-05-22 HISTORY — PX: TEE WITHOUT CARDIOVERSION: SHX5443

## 2018-05-22 SURGERY — ECHOCARDIOGRAM, TRANSESOPHAGEAL
Anesthesia: Moderate Sedation

## 2018-05-22 MED ORDER — MIDAZOLAM HCL 5 MG/ML IJ SOLN
INTRAMUSCULAR | Status: AC
  Filled 2018-05-22: qty 2

## 2018-05-22 MED ORDER — FENTANYL CITRATE (PF) 100 MCG/2ML IJ SOLN
INTRAMUSCULAR | Status: DC | PRN
  Administered 2018-05-22 (×2): 25 ug via INTRAVENOUS
  Administered 2018-05-22: 50 ug via INTRAVENOUS

## 2018-05-22 MED ORDER — MIDAZOLAM HCL 5 MG/5ML IJ SOLN
INTRAMUSCULAR | Status: DC | PRN
  Administered 2018-05-22: 1 mg via INTRAVENOUS

## 2018-05-22 MED ORDER — SODIUM CHLORIDE 0.9 % IV SOLN
INTRAVENOUS | Status: DC
  Administered 2018-05-22: 08:00:00 via INTRAVENOUS

## 2018-05-22 MED ORDER — FENTANYL CITRATE (PF) 100 MCG/2ML IJ SOLN
INTRAMUSCULAR | Status: AC
  Filled 2018-05-22: qty 2

## 2018-05-22 MED ORDER — MIDAZOLAM HCL 10 MG/2ML IJ SOLN
INTRAMUSCULAR | Status: DC | PRN
  Administered 2018-05-22: 1 mg via INTRAVENOUS

## 2018-05-22 MED ORDER — BUTAMBEN-TETRACAINE-BENZOCAINE 2-2-14 % EX AERO
INHALATION_SPRAY | CUTANEOUS | Status: DC | PRN
  Administered 2018-05-22: 2 via TOPICAL

## 2018-05-22 NOTE — Interval H&P Note (Signed)
History and Physical Interval Note:  05/22/2018 9:11 AM  Douglas Fritz  has presented today for surgery, with the diagnosis of MITRAL REGURGITATION  The various methods of treatment have been discussed with the patient and family. After consideration of risks, benefits and other options for treatment, the patient has consented to  Procedure(s): TRANSESOPHAGEAL ECHOCARDIOGRAM (TEE) (N/A) as a surgical intervention .  The patient's history has been reviewed, patient examined, no change in status, stable for surgery.  I have reviewed the patient's chart and labs.  Questions were answered to the patient's satisfaction.     Adrian Prows

## 2018-05-22 NOTE — Progress Notes (Addendum)
  Echocardiogram Echocardiogram Transesophageal has been performed.  [place] Timothy L Androw 05/22/2018, 11:02 AM

## 2018-05-22 NOTE — CV Procedure (Signed)
Indication: A. Fibrillation, mitral regurgitation  TEE: Under moderate sedation, TEE was performed without complications: LV: Normal. Normal EF. RV: Borderline dilated. Normal LA: Dilated, moderate to severe. Left atrial appendage: Normal without thrombus. Normal function. Inter atrial septum is intact without defect by color Doppler. RA: Severely dilated. MV: Mild MVP anterior leaflet with mild to at most moderate posteriorly directed MR. TV: Annulus mildly dilated. Moderate to severe TR. No pulmonary hypertension. AV: Normal. No AI or AS. PV: Normal. Trace PI.  Thoracic and ascending aorta:  Minimal atheromatous changes.  Conscious sedation protocol was followed, I personally administered conscious sedation and monitored the patient. Patient received 2 milligrams of Versed and 100 mcg fentanyl . Patient tolerated the procedure well and there was no complication from conscious sedation. Time administered was 20 minutes.

## 2018-05-24 ENCOUNTER — Encounter (HOSPITAL_COMMUNITY): Payer: Self-pay | Admitting: Cardiology

## 2018-06-01 DIAGNOSIS — R0609 Other forms of dyspnea: Secondary | ICD-10-CM | POA: Diagnosis not present

## 2018-06-01 DIAGNOSIS — I341 Nonrheumatic mitral (valve) prolapse: Secondary | ICD-10-CM | POA: Diagnosis not present

## 2018-06-01 DIAGNOSIS — I10 Essential (primary) hypertension: Secondary | ICD-10-CM | POA: Diagnosis not present

## 2018-06-01 DIAGNOSIS — I4891 Unspecified atrial fibrillation: Secondary | ICD-10-CM | POA: Diagnosis not present

## 2018-06-02 ENCOUNTER — Encounter (HOSPITAL_COMMUNITY): Admission: RE | Payer: Self-pay | Source: Ambulatory Visit

## 2018-06-02 ENCOUNTER — Ambulatory Visit (HOSPITAL_COMMUNITY): Admission: RE | Admit: 2018-06-02 | Payer: Medicare HMO | Source: Ambulatory Visit | Admitting: Cardiology

## 2018-06-02 SURGERY — RIGHT/LEFT HEART CATH AND CORONARY ANGIOGRAPHY
Anesthesia: LOCAL

## 2018-06-08 DIAGNOSIS — I4891 Unspecified atrial fibrillation: Secondary | ICD-10-CM | POA: Diagnosis not present

## 2018-06-08 DIAGNOSIS — R0602 Shortness of breath: Secondary | ICD-10-CM | POA: Diagnosis not present

## 2018-06-08 DIAGNOSIS — I10 Essential (primary) hypertension: Secondary | ICD-10-CM | POA: Diagnosis not present

## 2018-06-23 DIAGNOSIS — I4891 Unspecified atrial fibrillation: Secondary | ICD-10-CM | POA: Diagnosis not present

## 2018-06-26 DIAGNOSIS — L218 Other seborrheic dermatitis: Secondary | ICD-10-CM | POA: Diagnosis not present

## 2018-06-26 DIAGNOSIS — D225 Melanocytic nevi of trunk: Secondary | ICD-10-CM | POA: Diagnosis not present

## 2018-06-26 DIAGNOSIS — Z85820 Personal history of malignant melanoma of skin: Secondary | ICD-10-CM | POA: Diagnosis not present

## 2018-06-26 DIAGNOSIS — D692 Other nonthrombocytopenic purpura: Secondary | ICD-10-CM | POA: Diagnosis not present

## 2018-06-26 DIAGNOSIS — L812 Freckles: Secondary | ICD-10-CM | POA: Diagnosis not present

## 2018-06-26 DIAGNOSIS — Z85828 Personal history of other malignant neoplasm of skin: Secondary | ICD-10-CM | POA: Diagnosis not present

## 2018-06-26 DIAGNOSIS — D2262 Melanocytic nevi of left upper limb, including shoulder: Secondary | ICD-10-CM | POA: Diagnosis not present

## 2018-06-26 DIAGNOSIS — L821 Other seborrheic keratosis: Secondary | ICD-10-CM | POA: Diagnosis not present

## 2018-06-29 DIAGNOSIS — I4891 Unspecified atrial fibrillation: Secondary | ICD-10-CM | POA: Diagnosis present

## 2018-06-29 NOTE — H&P (Signed)
OFFICE VISIT NOTES COPIED TO EPIC FOR DOCUMENTATION  . History of Present Illness Laverda Page MD; 06/03/2018 4:59 AM) Patient words: Follow up TEE results; Last office visit 05/13/18.  The patient is a 77 year old male who presents for a Follow-up for Atrial fibrillation.  Additional reasons for visit:  Follow-up for Mitral regurgitation is described as the following: Mr. Jane Broughton is a Caucasian male with history of hypertension, prostate cancer, Barrett's esophagus, referred to [place] for evaluation of new onset atrial fibrillation that was noted during colonoscopy/endoscopy on 04/05/18 and seen next day by PCP and was started on Apixaban and metoprolol.  Patient was symptomatic prior to diagnosis, admitting to some fatigue and shortness of breath on exertion and also new leg edema. He underwent TEE on 05/22/2018 which reveals only mild to moderate MR and moderate to severe TR with mild RV enlargement and no pulmonary hypertension.  At this point symptoms of heart failure with dyspnea has improved, leg edema has resolved and fatigue is improved and he states that he is nearly back to his baseline. He is tolerating statin and also low dose BB (could not tolerate high dose due to insomnia) and ARB/HCT.   Problem List/Past Medical (April Harrington; 2018-06-12 2:34 PM) Dyspepsia (R10.13)  History of colon polyps (Z86.010)  Acid reflux (K21.9)  Depression (F32.9)  Barrett's esophagus (K22.70)  Benign essential hypertension (I10)  Laboratory examination (Z01.89)  05/20/2018: Creatinine 1.02, EGFR 71/82, potassium 4.2, BMP otherwise normal. MCV 99, CBC otherwise normal. 05/05/2018: Creatinine 1.06, EGFR 67/78, potassium 4.7, BMP normal. 04/03/2018: CBC normal. Creatinine 0.96, EGFR 76/92, potassium 4.4, CMP normal. TSH 2.75. Hemoglobin A1c 6.1%. Cholesterol 161, triglycerides 52, HDL 49, LDL 102 Atrial fibrillation, controlled (I48.91)  CHA2DS2-VASc Score is 3 with yearly risk of stroke  of 3.2 %. EKG 04/20/2018: Atrial fibrillation with controlled ventricular response at the rate of 60 bpm, normal axis. Incomplete right bundle branch block. Poor R-wave progression, cannot exclude anterior infarct old. Lateral T-wave inversion, cannot exclude ischemia versus LVH with repolarization abnormality. Abnormal EKG. Mild hyperlipidemia (E78.5)  Dyspnea on exertion (R06.09)  Mitral valve prolapse (I34.1)  TEE 05/22/2018: Normal LV size and function. EF 55-60%. Moderate MVP, mild to moderate MR. RV borderline enlarged with normal function. Moderate to severe TR. No pulmonary hypertensino. Echocardiogram 04/30/2018: Left ventricle cavity is normal in size. Mild concentric hypertrophy of the left ventricle. Normal global wall motion. Unable to evaluate diastolic function due to atrial fibrillation and severity of mitral regurgitation. Systolic and diastolic septal flatterning, suggestive of right sided pressure and volume overload. Calculated EF 55%. Left atrial cavity is severely dilated. Right atrial cavity is severely dilated. Moderate to severe mitral regurgitation. Moderate tricuspid regurgitation. Moderate pulmonary hypertension. Estimated pulmonary artery systolic pressure 50 mmHg. IVC is dilated with a respiratory response of <50%. Suggests elevated central venous pressure.  Allergies (April Harrington; 06/12/18 2:34 PM) No Known Drug Allergies [04/20/2018]:  Family History (April Harrington; 06-12-2018 2:34 PM) Mother  Deceased. At age 62; breast cancer, No heart issues Father  Deceased. at age 7; heart disease  Social History (April Harrington; June 12, 2018 2:34 PM) Current tobacco use  Never smoker. Alcohol Use  Occasional alcohol use. Marital status  Married. Living Situation  Lives with spouse. Number of Children  3.  Past Surgical History (April Harrington; Jun 12, 2018 2:34 PM) Insertion prostate radiation seed [1997]: Knee Replacement, Total  Right. Melanoma excision  [11/2012]:  Medication History Laverda Page, MD; 06/03/2018 4:59 AM) Valsartan-hydroCHLOROthiazide (160-12.'5MG'$  Tablet, 1 (one)  Tablet Oral every morning, Taken starting 05/13/2018) Active. Metoprolol Tartrate ('25MG'$  Tablet,  Tablet Tablet Oral two times daily, Taken starting 04/20/2018) Active. (Reduced dose due to fatigue and insomnia) Atorvastatin Calcium ('10MG'$  Tablet, 1 (one) Tablet Tablet Oral daily, Taken starting 04/20/2018) Active. Sertraline HCl ('100MG'$  Tablet, 1 Oral daily) Active. Omeprazole ('20MG'$  Capsule DR, 1 Oral daily) Active. Eliquis ('5MG'$  Tablet, 1 Oral two times daily) Active. Medications Reconciled (verbally with pt; no list or medication present)  Diagnostic Studies History Laverda Page, MD; 06/03/2018 5:00 AM) Echocardiogram  Echocardiogram 04/30/2018: Left ventricle cavity is normal in size. Mild concentric hypertrophy of the left ventricle. Normal global wall motion. Unable to evaluate diastolic function due to atrial fibrillation and severity of mitral regurgitation. Systolic and diastolic septal flatterning, suggestive of right sided pressure and volume overload. Calculated EF 55%. Left atrial cavity is severely dilated. Right atrial cavity is severely dilated. Moderate to severe mitral regurgitation. Moderate tricuspid regurgitation. Moderate pulmonary hypertension. Estimated pulmonary artery systolic pressure 50 mmHg. IVC is dilated with a respiratory response of <50%. Suggests elevated central venous pressure. TEE  05/22/2018: Normal LV size and function. EF 55-60%. Moderate MVP, mild to moderate MR. RV borderline enlarged with normal function. Moderate to severe TR. No pulmonary hypertension.    Review of Systems Laverda Page MD; 06/03/2018 4:57 AM) General Present- Fatigue (improved since last OV). Not Present- Appetite Loss and Weight Gain. Respiratory Not Present- Chronic Cough and Wakes up from Sleep Wheezing or Short of  Breath. Cardiovascular Present- Difficulty Breathing On Exertion (very mild). Not Present- Chest Pain, Difficulty Breathing Lying Down and Edema. Gastrointestinal Not Present- Black, Tarry Stool, Bloody Stool and Difficulty Swallowing. Musculoskeletal Not Present- Decreased Range of Motion and Muscle Atrophy. Neurological Not Present- Attention Deficit. Psychiatric Present- Insomnia (Since starting metoprolol, improved with decreasing the dose). Not Present- Personality Changes and Suicidal Ideation. Endocrine Not Present- Cold Intolerance and Heat Intolerance. Hematology Not Present- Abnormal Bleeding. All other systems negative  Vitals (April Harrington; 06/01/2018 2:49 PM) 06/01/2018 2:46 PM Weight: 165.38 lb Height: 68in Body Surface Area: 1.89 m Body Mass Index: 25.14 kg/m  Pulse: 63 (Regular)  P.OX: 96% (Room air) BP: 133/84 (Sitting, Left Arm, Standard)       Physical Exam Laverda Page, MD; 06/03/2018 5:0 AM) General Mental Status-Alert. General Appearance-Cooperative and Appears younger than stated age. Build & Nutrition-Well built and Well nourished.  Head and Neck Thyroid Gland Characteristics - normal size and consistency and no palpable nodules.  Chest and Lung Exam Chest and lung exam reveals -quiet, even and easy respiratory effort with no use of accessory muscles, non-tender and on auscultation, normal breath sounds, no adventitious sounds.  Cardiovascular Cardiovascular examination reveals -carotid auscultation reveals no bruits, abdominal aorta auscultation reveals no bruits and no prominent pulsation, femoral artery auscultation bilaterally reveals normal pulses, no bruits, no thrills and normal pedal pulses bilaterally. Auscultation Rhythm - Irregularly irregular and Bradycardic. Murmurs & Other Heart Sounds: Murmur - Location - Mitral Area. Timing - Holosystolic. Grade - III/VI. Radiation - Left  axilla.  Abdomen Palpation/Percussion Normal exam - Non Tender and No hepatosplenomegaly.  Peripheral Vascular Lower Extremity Palpation - Edema - Bilateral - 1+ Pitting edema.  Neurologic Neurologic evaluation reveals -alert and oriented x 3 with no impairment of recent or remote memory. Motor-Grossly intact without any focal deficits.  Musculoskeletal Global Assessment Left Lower Extremity - no deformities, masses or tenderness, no known fractures. Right Lower Extremity - no deformities, masses or tenderness, no known fractures.  Assessment & Plan Laverda Page MD; 06/03/2018 5:03 AM) Atrial fibrillation, controlled (I48.91) Story: CHA2DS2-VASc Score is 3 with yearly risk of stroke of 3.2 %.  EKG 06/01/2018: Atrial fibrillation with controlled 20) at the rate of 46 bpm, normal axis, LVH with repolarization, cannot exclude lateral ischemia. Nonspecific ST-T abnormality. Abnormal EKG. No significant change from EKG 04/20/2018 Current Plans Complete electrocardiogram (93000) Future Plans 06/08/2018: Myocardial perfusion imaging, tomographic (SPECT) (including attenuation correction, qualitative or quantitative wall motion, ejection fraction by first pass or gated technique, additional quantification, when performed) - one time 2/54/2706: METABOLIC PANEL, BASIC (23762) - one time Mitral valve prolapse (I34.1) Story: TEE 05/22/2018: Normal LV size and function. EF 55-60%. Moderate MVP, mild to moderate MR. RV borderline enlarged with normal function. Moderate to severe TR. No pulmonary hypertension.  Echocardiogram 04/30/2018: Left ventricle cavity is normal in size. Mild concentric hypertrophy of the left ventricle. Normal global wall motion. Unable to evaluate diastolic function due to atrial fibrillation and severity of mitral regurgitation. Systolic and diastolic septal flatterning, suggestive of right sided pressure and volume overload. Calculated EF 55%. Left atrial  cavity is severely dilated. Right atrial cavity is severely dilated. Moderate to severe mitral regurgitation. Moderate tricuspid regurgitation. Moderate pulmonary hypertension. Estimated pulmonary artery systolic pressure 50 mmHg. IVC is dilated with a respiratory response of <50%. Suggests elevated central venous pressure. Dyspnea on exertion (R06.09) Benign essential hypertension (I10) Mild hyperlipidemia (E78.5) Laboratory examination (G31.51) Story: 05/20/2018: Creatinine 1.02, EGFR 71/82, potassium 4.2, BMP otherwise normal. MCV 99, CBC otherwise normal.  05/05/2018: Creatinine 1.06, EGFR 67/78, potassium 4.7, BMP normal.  04/03/2018: CBC normal. Creatinine 0.96, EGFR 76/92, potassium 4.4, CMP normal. TSH 2.75. Hemoglobin A1c 6.1%. Cholesterol 161, triglycerides 52, HDL 49, LDL 102  Note:. Recommendation:  Mr. Jeanluc Wegman is a Caucasian male, Originally from [place], with history of hypertension, prostate cancer, Barrett's esophagus, referred to [place] for evaluation of new onset atrial fibrillation, fatigue and dyspnea that was noted during colonoscopy/endoscopy on 04/05/18. Physical exam and also echocardiogram on 04/30/2018 and revealed severe MR  He underwent TEE on 05/22/2018 which reveals only mild to moderate MR and moderate to severe TR with mild RV enlargement and no pulmonary hypertension compared to TTE (mod-severe MR and mod PH).  At this point symptoms of heart failure with dyspnea has improved, leg edema has resolved and fatigue is improved and he states that he is nearly back to his baseline. He is tolerating all his medications well. I'll canceled is coronary angiogram, I'll set him up for a exercise Myoview stress test in view of his symptoms and abnormal EKG exclude ischemic etiology as well.   If the nuclear stress test is negative for ischemia, Direct current cardioversion which will help with remodeling of is atrial and also probably maintain diastolic function would be  reasonable to attempt as he has been on anticoagulation now. I will tentatively set this up as well.  CC: Dr. Lujean Amel (PCP) Addendum Sunday Spillers MD; 06/29/2018 7:26 PM) 06/25/2018: Creatinine 1.2, EGFR 58/67, potassium 4.2, BMP otherwise normal.   Signed by Laverda Page, MD (06/03/2018 5:03 AM)

## 2018-06-30 ENCOUNTER — Ambulatory Visit (HOSPITAL_COMMUNITY): Payer: Medicare HMO | Admitting: Certified Registered Nurse Anesthetist

## 2018-06-30 ENCOUNTER — Encounter (HOSPITAL_COMMUNITY): Payer: Self-pay

## 2018-06-30 ENCOUNTER — Encounter (HOSPITAL_COMMUNITY)
Admission: RE | Disposition: A | Discharge status: Home / Self Care | Payer: Self-pay | Source: Ambulatory Visit | Attending: Cardiology

## 2018-06-30 ENCOUNTER — Ambulatory Visit (HOSPITAL_COMMUNITY)
Admission: RE | Admit: 2018-06-30 | Discharge: 2018-06-30 | Disposition: A | Discharge status: Home / Self Care | Payer: Medicare HMO | Source: Ambulatory Visit | Attending: Cardiology | Admitting: Cardiology

## 2018-06-30 ENCOUNTER — Other Ambulatory Visit: Payer: Self-pay

## 2018-06-30 DIAGNOSIS — K2270 Barrett's esophagus without dysplasia: Secondary | ICD-10-CM | POA: Diagnosis not present

## 2018-06-30 DIAGNOSIS — Z8546 Personal history of malignant neoplasm of prostate: Secondary | ICD-10-CM | POA: Insufficient documentation

## 2018-06-30 DIAGNOSIS — Z803 Family history of malignant neoplasm of breast: Secondary | ICD-10-CM | POA: Insufficient documentation

## 2018-06-30 DIAGNOSIS — I4891 Unspecified atrial fibrillation: Secondary | ICD-10-CM | POA: Insufficient documentation

## 2018-06-30 DIAGNOSIS — Z86010 Personal history of colonic polyps: Secondary | ICD-10-CM | POA: Insufficient documentation

## 2018-06-30 DIAGNOSIS — Z9889 Other specified postprocedural states: Secondary | ICD-10-CM | POA: Insufficient documentation

## 2018-06-30 DIAGNOSIS — Z79899 Other long term (current) drug therapy: Secondary | ICD-10-CM | POA: Diagnosis not present

## 2018-06-30 DIAGNOSIS — R0609 Other forms of dyspnea: Secondary | ICD-10-CM | POA: Diagnosis not present

## 2018-06-30 DIAGNOSIS — E785 Hyperlipidemia, unspecified: Secondary | ICD-10-CM | POA: Insufficient documentation

## 2018-06-30 DIAGNOSIS — Z96651 Presence of right artificial knee joint: Secondary | ICD-10-CM | POA: Insufficient documentation

## 2018-06-30 DIAGNOSIS — Z85820 Personal history of malignant melanoma of skin: Secondary | ICD-10-CM | POA: Insufficient documentation

## 2018-06-30 DIAGNOSIS — I10 Essential (primary) hypertension: Secondary | ICD-10-CM | POA: Insufficient documentation

## 2018-06-30 DIAGNOSIS — Z7901 Long term (current) use of anticoagulants: Secondary | ICD-10-CM | POA: Insufficient documentation

## 2018-06-30 DIAGNOSIS — I491 Atrial premature depolarization: Secondary | ICD-10-CM | POA: Insufficient documentation

## 2018-06-30 DIAGNOSIS — I340 Nonrheumatic mitral (valve) insufficiency: Secondary | ICD-10-CM | POA: Diagnosis not present

## 2018-06-30 DIAGNOSIS — Z8249 Family history of ischemic heart disease and other diseases of the circulatory system: Secondary | ICD-10-CM | POA: Diagnosis not present

## 2018-06-30 DIAGNOSIS — I4510 Unspecified right bundle-branch block: Secondary | ICD-10-CM | POA: Insufficient documentation

## 2018-06-30 DIAGNOSIS — K219 Gastro-esophageal reflux disease without esophagitis: Secondary | ICD-10-CM | POA: Diagnosis not present

## 2018-06-30 HISTORY — PX: CARDIOVERSION: SHX1299

## 2018-06-30 SURGERY — CARDIOVERSION
Anesthesia: General

## 2018-06-30 MED ORDER — SODIUM CHLORIDE 0.9 % IV SOLN
INTRAVENOUS | Status: DC
  Administered 2018-06-30: 12:00:00 via INTRAVENOUS

## 2018-06-30 MED ORDER — LIDOCAINE 2% (20 MG/ML) 5 ML SYRINGE
INTRAMUSCULAR | Status: DC | PRN
  Administered 2018-06-30: 60 mg via INTRAVENOUS

## 2018-06-30 MED ORDER — PROPOFOL 10 MG/ML IV BOLUS
INTRAVENOUS | Status: DC | PRN
  Administered 2018-06-30: 30 mg via INTRAVENOUS
  Administered 2018-06-30: 50 mg via INTRAVENOUS

## 2018-06-30 NOTE — Anesthesia Procedure Notes (Signed)
Procedure Name: General with mask airway Date/Time: 06/30/2018 12:47 PM Performed by: Trinna Post., CRNA Patient Re-evaluated:Patient Re-evaluated prior to induction Oxygen Delivery Method: Ambu bag Preoxygenation: Pre-oxygenation with 100% oxygen Induction Type: IV induction

## 2018-06-30 NOTE — Discharge Instructions (Signed)
Electrical Cardioversion, Care After °This sheet gives you information about how to care for yourself after your procedure. Your health care provider may also give you more specific instructions. If you have problems or questions, contact your health care provider. °What can I expect after the procedure? °After the procedure, it is common to have: °· Some redness on the skin where the shocks were given. ° °Follow these instructions at home: °· Do not drive for 24 hours if you were given a medicine to help you relax (sedative). °· Take over-the-counter and prescription medicines only as told by your health care provider. °· Ask your health care provider how to check your pulse. Check it often. °· Rest for 48 hours after the procedure or as told by your health care provider. °· Avoid or limit your caffeine use as told by your health care provider. °Contact a health care provider if: °· You feel like your heart is beating too quickly or your pulse is not regular. °· You have a serious muscle cramp that does not go away. °Get help right away if: °· You have discomfort in your chest. °· You are dizzy or you feel faint. °· You have trouble breathing or you are short of breath. °· Your speech is slurred. °· You have trouble moving an arm or leg on one side of your body. °· Your fingers or toes turn cold or blue. °This information is not intended to replace advice given to you by your health care provider. Make sure you discuss any questions you have with your health care provider. °Document Released: 06/30/2013 Document Revised: 04/12/2016 Document Reviewed: 03/15/2016 °Elsevier Interactive Patient Education © 2018 [employer] ° °

## 2018-06-30 NOTE — Anesthesia Postprocedure Evaluation (Signed)
Anesthesia Post Note  Patient: Douglas Fritz  Procedure(s) Performed: CARDIOVERSION (N/A )     Patient location during evaluation: PACU Anesthesia Type: General Level of consciousness: awake and alert Pain management: pain level controlled Vital Signs Assessment: post-procedure vital signs reviewed and stable Respiratory status: spontaneous breathing, nonlabored ventilation, respiratory function stable and patient connected to nasal cannula oxygen Cardiovascular status: blood pressure returned to baseline and stable Postop Assessment: no apparent nausea or vomiting Anesthetic complications: no    Last Vitals:  Vitals:   06/30/18 1300 06/30/18 1310  BP: 99/64 (!) 133/42  Pulse: (!) 34 (!) 50  Resp: 19 20  Temp:    SpO2: 100% 95%    Last Pain:  Vitals:   06/30/18 1310  TempSrc:   PainSc: 0-No pain                 Ryan P Ellender

## 2018-06-30 NOTE — CV Procedure (Addendum)
Direct current cardioversion:  Indication symptomatic A. Fibrillation.  Procedure: Using 80 mg of IV Propofol and 60 IV Lidocaine (for reducing venous pain) for achieving deep sedation, synchronized direct current cardioversion performed. Patient was delivered with 100 Joules of electricity X 1 with success to NSR. Patient tolerated the procedure well. No immediate complication noted.   Due to episodes of bradycardia and occasinal Mobitz II av block after he recovered and prior to discharge, I will discontinue Metoprolol 12.5 mg BID that he was on.

## 2018-06-30 NOTE — Interval H&P Note (Signed)
History and Physical Interval Note:  06/30/2018 12:36 PM  Douglas Fritz  has presented today for surgery, with the diagnosis of ATRIAL FIBRILLATION  The various methods of treatment have been discussed with the patient and family. After consideration of risks, benefits and other options for treatment, the patient has consented to  Procedure(s): CARDIOVERSION (N/A) as a surgical intervention .  The patient's history has been reviewed, patient examined, no change in status, stable for surgery.  I have reviewed the patient's chart and labs.  Questions were answered to the patient's satisfaction.     Adrian Prows

## 2018-06-30 NOTE — Anesthesia Preprocedure Evaluation (Addendum)
Anesthesia Evaluation  Patient identified by MRN, date of birth, ID band Patient awake    Reviewed: Allergy & Precautions, NPO status , Patient's Chart, lab work & pertinent test results, reviewed documented beta blocker date and time   Airway Mallampati: II  TM Distance: >3 FB Neck ROM: Full    Dental no notable dental hx.    Pulmonary neg pulmonary ROS,    Pulmonary exam normal breath sounds clear to auscultation       Cardiovascular hypertension, Pt. on home beta blockers and Pt. on medications Normal cardiovascular exam Rhythm:Regular Rate:Normal     Neuro/Psych PSYCHIATRIC DISORDERS Anxiety Depression negative neurological ROS     GI/Hepatic Neg liver ROS, GERD  ,  Endo/Other  negative endocrine ROS  Renal/GU negative Renal ROS     Musculoskeletal negative musculoskeletal ROS (+)   Abdominal   Peds  Hematology HLD   Anesthesia Other Findings ATRIAL FIBRILLATION  Reproductive/Obstetrics                            Anesthesia Physical Anesthesia Plan  ASA: III  Anesthesia Plan: General   Post-op Pain Management:    Induction: Intravenous  PONV Risk Score and Plan: 2 and Propofol infusion and Treatment may vary due to age or medical condition  Airway Management Planned: Mask  Additional Equipment:   Intra-op Plan:   Post-operative Plan: Extubation in OR  Informed Consent: I have reviewed the patients History and Physical, chart, labs and discussed the procedure including the risks, benefits and alternatives for the proposed anesthesia with the patient or authorized representative who has indicated his/her understanding and acceptance.   Dental advisory given  Plan Discussed with: CRNA  Anesthesia Plan Comments:         Anesthesia Quick Evaluation

## 2018-06-30 NOTE — Transfer of Care (Signed)
Immediate Anesthesia Transfer of Care Note  Patient: Douglas Fritz  Procedure(s) Performed: CARDIOVERSION (N/A )  Patient Location: Endoscopy Unit  Anesthesia Type:General  Level of Consciousness: drowsy and responds to stimulation  Airway & Oxygen Therapy: Patient Spontanous Breathing  Post-op Assessment: Report given to RN and Post -op Vital signs reviewed and stable  Post vital signs: Reviewed and stable  Last Vitals:  Vitals Value Taken Time  BP    Temp    Pulse    Resp    SpO2      Last Pain:  Vitals:   06/30/18 1149  TempSrc: Oral  PainSc: 0-No pain         Complications: No apparent anesthesia complications

## 2018-07-02 ENCOUNTER — Encounter (HOSPITAL_COMMUNITY): Payer: Self-pay | Admitting: Cardiology

## 2018-07-10 DIAGNOSIS — I341 Nonrheumatic mitral (valve) prolapse: Secondary | ICD-10-CM | POA: Diagnosis not present

## 2018-07-10 DIAGNOSIS — Z9889 Other specified postprocedural states: Secondary | ICD-10-CM | POA: Diagnosis not present

## 2018-07-10 DIAGNOSIS — R0609 Other forms of dyspnea: Secondary | ICD-10-CM | POA: Diagnosis not present

## 2018-07-10 DIAGNOSIS — I4891 Unspecified atrial fibrillation: Secondary | ICD-10-CM | POA: Diagnosis not present

## 2018-07-17 DIAGNOSIS — R69 Illness, unspecified: Secondary | ICD-10-CM | POA: Diagnosis not present

## 2018-08-12 DIAGNOSIS — I10 Essential (primary) hypertension: Secondary | ICD-10-CM | POA: Diagnosis not present

## 2018-08-12 DIAGNOSIS — I341 Nonrheumatic mitral (valve) prolapse: Secondary | ICD-10-CM | POA: Diagnosis not present

## 2018-08-12 DIAGNOSIS — Z0189 Encounter for other specified special examinations: Secondary | ICD-10-CM | POA: Diagnosis not present

## 2018-08-12 DIAGNOSIS — I4891 Unspecified atrial fibrillation: Secondary | ICD-10-CM | POA: Diagnosis not present

## 2018-10-17 ENCOUNTER — Other Ambulatory Visit: Payer: Self-pay | Admitting: Cardiology

## 2018-10-17 DIAGNOSIS — I4891 Unspecified atrial fibrillation: Secondary | ICD-10-CM

## 2018-10-30 DIAGNOSIS — D1801 Hemangioma of skin and subcutaneous tissue: Secondary | ICD-10-CM | POA: Diagnosis not present

## 2018-10-30 DIAGNOSIS — L821 Other seborrheic keratosis: Secondary | ICD-10-CM | POA: Diagnosis not present

## 2018-10-30 DIAGNOSIS — D225 Melanocytic nevi of trunk: Secondary | ICD-10-CM | POA: Diagnosis not present

## 2018-10-30 DIAGNOSIS — Z85828 Personal history of other malignant neoplasm of skin: Secondary | ICD-10-CM | POA: Diagnosis not present

## 2018-10-30 DIAGNOSIS — L308 Other specified dermatitis: Secondary | ICD-10-CM | POA: Diagnosis not present

## 2018-10-30 DIAGNOSIS — D485 Neoplasm of uncertain behavior of skin: Secondary | ICD-10-CM | POA: Diagnosis not present

## 2018-10-30 DIAGNOSIS — L812 Freckles: Secondary | ICD-10-CM | POA: Diagnosis not present

## 2018-10-30 DIAGNOSIS — C44519 Basal cell carcinoma of skin of other part of trunk: Secondary | ICD-10-CM | POA: Diagnosis not present

## 2018-11-03 ENCOUNTER — Ambulatory Visit: Payer: Medicare HMO

## 2018-11-03 ENCOUNTER — Other Ambulatory Visit: Payer: Self-pay

## 2018-11-03 DIAGNOSIS — I4891 Unspecified atrial fibrillation: Secondary | ICD-10-CM | POA: Diagnosis not present

## 2018-11-03 DIAGNOSIS — I341 Nonrheumatic mitral (valve) prolapse: Secondary | ICD-10-CM | POA: Diagnosis not present

## 2018-11-13 ENCOUNTER — Encounter: Payer: Self-pay | Admitting: Cardiology

## 2018-11-13 ENCOUNTER — Ambulatory Visit: Payer: Medicare HMO | Admitting: Cardiology

## 2018-11-13 VITALS — BP 145/83 | HR 74 | Ht 68.0 in | Wt 159.5 lb

## 2018-11-13 DIAGNOSIS — I340 Nonrheumatic mitral (valve) insufficiency: Secondary | ICD-10-CM

## 2018-11-13 DIAGNOSIS — I4821 Permanent atrial fibrillation: Secondary | ICD-10-CM

## 2018-11-13 DIAGNOSIS — Z298 Encounter for other specified prophylactic measures: Secondary | ICD-10-CM

## 2018-11-13 DIAGNOSIS — I341 Nonrheumatic mitral (valve) prolapse: Secondary | ICD-10-CM | POA: Diagnosis not present

## 2018-11-13 MED ORDER — AMOXICILLIN 500 MG PO TABS: 2000.0000 mg | ORAL_TABLET | Freq: Once | ORAL | 2 refills | Status: DC | PRN

## 2018-11-13 NOTE — Progress Notes (Signed)
Subjective:  Primary Physician:  Douglas Amel, MD  Patient ID: Douglas Fritz, male    DOB: Aug 25, 1941, 78 y.o.   MRN: [id number]  Chief Complaint  Patient presents with  . Atrial Fibrillation  . Mitral Valve Prolapse  . Follow-up    3 mth , echo results, last ekg 08/12/18    HPI: Douglas Fritz  is a 78 y.o. male  with Mr. Douglas Fritz is a Caucasian male with history of hypertension, prostate cancer, Barrett's esophagus, referred to [place] for evaluation of new onset atrial fibrillation that was noted during colonoscopy/endoscopy on 04/05/18.   Patient was symptomatic prior to diagnosis, admitting to some fatigue and shortness of breath on exertion and also new leg edema.  He was also found to have severe mitral regurgitation by physical exam.  After being on aggressive medical therapy, he has stabilized, essentially now in class I dyspnea, leg edema has completely resolved, no PND or orthopnea.  He did undergo cardioversion on 07/10/2018 but was back in A. fib on follow-up and is asymptomatic hence rate control strategies was felt to be appropriate.  He presents here for a 58-monthoffice visit and follow-up of echocardiogram.  No new symptomatology.  Past Medical History:  Diagnosis Date  . Anxiety   . Barrett's esophagus without dysplasia dx 2016  . Colon polyps   . Depression   . Dysphagia 03/03/2015  . ED (erectile dysfunction)   . H. pylori infection   . Hypertension   . Melanoma ([place]   . Prostate cancer ([place]   . Prostate cancer ([place]   . Pulmonary nodules   . Skin cancer    melanoma    Past Surgical History:  Procedure Laterality Date  . CARDIOVERSION N/A 06/30/2018   Procedure: CARDIOVERSION;  Surgeon: GAdrian Prows MD;  [facility]  Service: Cardiovascular;  Laterality: N/A;  . [place]NEE SURGERY Right   . MELANOMA EXCISION  11/2012  . TEE WITHOUT CARDIOVERSION N/A 05/22/2018   Procedure: TRANSESOPHAGEAL ECHOCARDIOGRAM  (TEE);  Surgeon: GAdrian Prows MD;  [facility];  Service: Cardiovascular;  Laterality: N/A;    Social History   Socioeconomic History  . Marital status: Married    Spouse name: Not on file  . Number of children: 3  . Years of education: Not on file  . Highest education level: Not on file  Occupational History  . Occupation: retired  [employer] laboratory technician . Financial resource strain: Not on file  . Food insecurity:    Worry: Not on file    Inability: Not on file  . Transportation needs:    Medical: Not on file    Non-medical: Not on file  Tobacco Use  . Smoking status: Never Smoker  . Smokeless tobacco: Never Used  Substance and Sexual Activity  . Alcohol use: Yes    Alcohol/week: 7.0 standard drinks    Types: 7 Cans of beer per week    Comment: SOCIAL  . Drug use: No  . Sexual activity: Not on file  Lifestyle  . Physical activity:    Days per week: Not on file    Minutes per session: Not on file  . Stress: Not on file  Relationships  . Social connections:    Talks on phone: Not on file    Gets together: Not on file    Attends religious service: Not on file    Active member of club or organization: Not on file  Attends meetings of clubs or organizations: Not on file    Relationship status: Not on file  . Intimate partner violence:    Fear of current or ex partner: Not on file    Emotionally abused: Not on file    Physically abused: Not on file    Forced sexual activity: Not on file  Other Topics Concern  . Not on file  Social History Narrative  . Not on file    Current Outpatient Medications on File Prior to Visit  Medication Sig Dispense Refill  . atorvastatin (LIPITOR) 10 MG tablet Take 10 mg by mouth daily.   2  . ELIQUIS 5 MG TABS tablet Take 5 mg by mouth 2 (two) times daily.  4  . omeprazole (PRILOSEC) 20 MG capsule Take 1 capsule (20 mg total) by mouth daily. 90 capsule 3  . sertraline (ZOLOFT) 100 MG tablet Take 100 mg by mouth daily.    .  valsartan-hydrochlorothiazide (DIOVAN-HCT) 160-12.5 MG tablet Take 1 tablet by mouth daily.  2   No current facility-administered medications on file prior to visit.      Review of Systems  Constitutional: Negative for malaise/fatigue and weight loss.  Respiratory: Positive for shortness of breath (very mild and improving). Negative for cough and hemoptysis.   Cardiovascular: Negative for chest pain, palpitations, claudication and leg swelling.  Gastrointestinal: Negative for abdominal pain, blood in stool, constipation, heartburn and vomiting.  Genitourinary: Negative for dysuria.  Musculoskeletal: Negative for joint pain and myalgias.  Neurological: Negative for dizziness, focal weakness and headaches.  Endo/Heme/Allergies: Does not bruise/bleed easily.  Psychiatric/Behavioral: Negative for depression. The patient is not nervous/anxious.   All other systems reviewed and are negative.      Objective:  Blood pressure (!) 145/83, pulse 74, height '5\' 8"'$  (1.727 m), weight 159 lb 8 oz (72.3 kg), SpO2 98 %. Body mass index is 24.25 kg/m.  Physical Exam  Constitutional: He appears healthy. No distress.  Eyes: Conjunctivae are normal.  Neck: Neck supple. No JVD present.  Cardiovascular: Regular rhythm, intact distal pulses and normal pulses. Exam reveals a midsystolic click. Exam reveals no gallop.  Murmur heard.  Crescendo-decrescendo mid to late systolic murmur is present with a grade of 3/6 at the apex. Pulmonary/Chest: Breath sounds normal. He exhibits no tenderness.  Abdominal: Soft. Bowel sounds are normal.  Musculoskeletal: Normal range of motion.  Neurological: He is alert and oriented to person, place, and time.  Skin: Skin is warm.    CARDIAC STUDIES:   Direct current cardioversion 100J A. Fib to S. Bradycardia. Occasional Mobitz II AV Block.  EKG 08/12/2018: Atrial fibrillation with controlled ventricular response at the rate of 65 bpm, normal axis.  Poor R-wave  progression, nonspecific T abnormality. No significant change from EKG 07/10/2018  Exercise myoview stress 06/08/2018: 1. The patient performed treadmill exercise using Bruce protocol, completing 5:30 minutes. The patient completed an estimated workload of 7 METS, reaching 105% of the maximum predicted heart rate. Resting hypertension 160/98 mmHg with normal hemodynamic response. No stress symptoms reported. Low exercise capacity. No ischemic changes seen on stress electrocardiogram.  2. The overall quality of the study is good. There is no evidence of abnormal lung activity.  Decreased uptake seen in entire myocardium on short axis stress images likely due to tissue attenuation. Otherwise, stress and rest SPECT images demonstrate homogeneous tracer distribution throughout the myocardium. Gated SPECT imaging reveals normal myocardial thickening and wall motion. The left ventricular ejection fraction was normal (66%).  3. Low risk study.  Echocardiogram 11/03/2018: Left ventricle cavity is normal in size. Mild concentric hypertrophy of the left ventricle. Normal global wall motion. Calculated EF 55%. Left atrial cavity is severely dilated. Right atrial cavity is mildly dilated. Mild prolapse of the mitral valve leaflets. Moderate (Grade III) mitral regurgitation. Moderate-severe tricuspid regurgitation. Estimated pulmonary artery systolic pressure 45  mmHg. Mild pulmonic regurgitation. Small pericardial effusion. No significant change compared to previous study on 04/30/2018.  Assessment & Recommendations:   1. Permanent atrial fibrillation - PCV ECHOCARDIOGRAM COMPLETE; Future  2. Mitral valve prolapse  - PCV ECHOCARDIOGRAM COMPLETE; Future  3. Moderate to severe mitral regurgitation - PCV ECHOCARDIOGRAM COMPLETE; Future  4. Laboratory Exam: 06/25/2018: Creatinine 1.2, EGFR 58/67, potassium 4.2, BMP otherwise normal.  05/20/2018: Creatinine 1.02, EGFR 71/82, potassium 4.2, BMP  otherwise normal.  MCV 99, CBC otherwise normal.   Recommendation: Patient is here on a three-month follow-up of chronic atrial fibrillation and severe MR and TR, mitral valve prolapse.  He is presently doing well and essentially remains asymptomatic.  There is no JVD, lungs are clear, there is no clinical evidence of heart failure.  Echocardiogram remains essentially unchanged from prior echocardiogram.  We again discussed the indications for surgery today. LV size and function has remained stable.   He appears to be doing well, hence will continue to do surveillance Doppler echocardiogram in 6 months and I would like to see him back then.  He has been scheduled for complete physical examination for lab evaluation in May, he will bring me the labs. His BP being good, will increase Valsartan to 320/12.5 mg.  He needs endocarditis prophylaxis in view of myxomatous degeneration.    Adrian Prows, MD, [facility] 11/13/2018, 2:50 PM [facility]. [place] Pager: [phone] Office: [phone] If no answer Cell [phone]

## 2018-12-10 ENCOUNTER — Telehealth: Payer: Self-pay

## 2018-12-10 NOTE — Telephone Encounter (Signed)
Pt called c/o you increasing valsartan and he doesn't think that he needs to be increased bp has been stable, it was only high because he has "whithe coat syndrome" Is it alright if he doesn't increase and see what happens

## 2018-12-10 NOTE — Telephone Encounter (Signed)
Yes he may. Keep BP < 130/80 mm Hg

## 2018-12-13 ENCOUNTER — Other Ambulatory Visit: Payer: Self-pay | Admitting: Cardiology

## 2019-02-02 DIAGNOSIS — K2270 Barrett's esophagus without dysplasia: Secondary | ICD-10-CM | POA: Diagnosis not present

## 2019-02-02 DIAGNOSIS — I4891 Unspecified atrial fibrillation: Secondary | ICD-10-CM | POA: Diagnosis not present

## 2019-02-02 DIAGNOSIS — I10 Essential (primary) hypertension: Secondary | ICD-10-CM | POA: Diagnosis not present

## 2019-02-02 DIAGNOSIS — Z0001 Encounter for general adult medical examination with abnormal findings: Secondary | ICD-10-CM | POA: Diagnosis not present

## 2019-02-02 DIAGNOSIS — R7301 Impaired fasting glucose: Secondary | ICD-10-CM | POA: Diagnosis not present

## 2019-02-02 DIAGNOSIS — R69 Illness, unspecified: Secondary | ICD-10-CM | POA: Diagnosis not present

## 2019-03-10 DIAGNOSIS — L503 Dermatographic urticaria: Secondary | ICD-10-CM | POA: Diagnosis not present

## 2019-03-10 DIAGNOSIS — Z85820 Personal history of malignant melanoma of skin: Secondary | ICD-10-CM | POA: Diagnosis not present

## 2019-03-10 DIAGNOSIS — L570 Actinic keratosis: Secondary | ICD-10-CM | POA: Diagnosis not present

## 2019-03-10 DIAGNOSIS — D225 Melanocytic nevi of trunk: Secondary | ICD-10-CM | POA: Diagnosis not present

## 2019-03-10 DIAGNOSIS — D2262 Melanocytic nevi of left upper limb, including shoulder: Secondary | ICD-10-CM | POA: Diagnosis not present

## 2019-03-10 DIAGNOSIS — L821 Other seborrheic keratosis: Secondary | ICD-10-CM | POA: Diagnosis not present

## 2019-03-10 DIAGNOSIS — D2261 Melanocytic nevi of right upper limb, including shoulder: Secondary | ICD-10-CM | POA: Diagnosis not present

## 2019-03-10 DIAGNOSIS — Z85828 Personal history of other malignant neoplasm of skin: Secondary | ICD-10-CM | POA: Diagnosis not present

## 2019-05-03 ENCOUNTER — Other Ambulatory Visit: Payer: Self-pay

## 2019-05-03 ENCOUNTER — Ambulatory Visit (INDEPENDENT_AMBULATORY_CARE_PROVIDER_SITE_OTHER): Payer: Medicare HMO

## 2019-05-03 DIAGNOSIS — I340 Nonrheumatic mitral (valve) insufficiency: Secondary | ICD-10-CM

## 2019-05-03 DIAGNOSIS — I341 Nonrheumatic mitral (valve) prolapse: Secondary | ICD-10-CM

## 2019-05-03 DIAGNOSIS — I4821 Permanent atrial fibrillation: Secondary | ICD-10-CM | POA: Diagnosis not present

## 2019-05-12 ENCOUNTER — Other Ambulatory Visit: Payer: Self-pay | Admitting: Cardiology

## 2019-05-12 ENCOUNTER — Ambulatory Visit (INDEPENDENT_AMBULATORY_CARE_PROVIDER_SITE_OTHER): Payer: Medicare HMO | Admitting: Cardiology

## 2019-05-12 ENCOUNTER — Other Ambulatory Visit: Payer: Self-pay

## 2019-05-12 ENCOUNTER — Encounter: Payer: Self-pay | Admitting: Cardiology

## 2019-05-12 VITALS — BP 160/100 | HR 64 | Temp 97.1°F | Ht 68.0 in | Wt 158.8 lb

## 2019-05-12 DIAGNOSIS — I340 Nonrheumatic mitral (valve) insufficiency: Secondary | ICD-10-CM

## 2019-05-12 DIAGNOSIS — I341 Nonrheumatic mitral (valve) prolapse: Secondary | ICD-10-CM

## 2019-05-12 DIAGNOSIS — I10 Essential (primary) hypertension: Secondary | ICD-10-CM

## 2019-05-12 DIAGNOSIS — I4821 Permanent atrial fibrillation: Secondary | ICD-10-CM | POA: Diagnosis not present

## 2019-05-12 MED ORDER — AMLODIPINE BESYLATE 10 MG PO TABS: 10.0000 mg | ORAL_TABLET | Freq: Every day | ORAL | 1 refills | Status: DC

## 2019-05-12 MED ORDER — AMLODIPINE BESYLATE 10 MG PO TABS: 10.0000 mg | ORAL_TABLET | Freq: Every day | ORAL | 0 refills | Status: DC

## 2019-05-12 NOTE — Patient Instructions (Signed)
Monitor your BP regularly at home. Call me if shortness of breath

## 2019-05-12 NOTE — Progress Notes (Signed)
Primary Physician/Referring:  Lujean Amel, MD  Patient ID: Douglas Fritz, male    DOB: 1941/07/17, 78 y.o.   MRN: [id number]  Chief Complaint  Patient presents with  . Atrial Fibrillation  . Mitral Valve Prolapse  . Results    echo  . Follow-up    60mo  HPI:    Douglas Fritz is a 78y.o. Caucasian male with history of hypertension, prostate cancer, Barrett's esophagus with new onset atrial fibrillation that was noted during colonoscopy/endoscopy on 04/05/18.   He presents here for a 366-monthffice visit and follow-up of echocardiogram, he has been exercising regularly without dyspnea or palpitations, dizziness or syncope.  No new symptomatology.  Past Medical History:  Diagnosis Date  . Anxiety   . Barrett's esophagus without dysplasia dx 2016  . Colon polyps   . Depression   . Dysphagia 03/03/2015  . ED (erectile dysfunction)   . H. pylori infection   . Hypertension   . [place] ([place]  . Prostate cancer ([facility]  . Prostate cancer ([facility]  . Pulmonary nodules   . Skin cancer    melanoma   Past Surgical History:  Procedure Laterality Date  . CARDIOVERSION N/A 06/30/2018   Procedure: CARDIOVERSION;  Surgeon: GaAdrian ProwsMD;  [facility]: [facility];  Laterality: N/A;  . [place]   . MELANOMA EXCISION  11/2012  . TEE WITHOUT CARDIOVERSION N/A 05/22/2018   Procedure: TRANSESOPHAGEAL ECHOCARDIOGRAM (TEE);  Surgeon: GaAdrian ProwsMD;  [facility];  Service: Cardiovascular;  Laterality: N/A;   Social History   Socioeconomic History  . Marital status: Married    Spouse name: Not on file  . Number of children: 3  . Years of education: Not on file  . Highest education level: Not on file  Occupational History  . Occupation: retired  [employer] laboratory technician. Financial resource strain: Not on file  . Food insecurity    Worry: Not on file    Inability: Not on file  . Transportation needs   Medical: Not on file    Non-medical: Not on file  Tobacco Use  . Smoking status: Never Smoker  . Smokeless tobacco: Never Used  Substance and Sexual Activity  . Alcohol use: Yes    Alcohol/week: 7.0 standard drinks    Types: 7 Cans of beer per week    Comment: SOCIAL  . Drug use: No  . Sexual activity: Not on file  Lifestyle  . Physical activity    Days per week: Not on file    Minutes per session: Not on file  . Stress: Not on file  Relationships  . Social coHerbalistn phone: Not on file    Gets together: Not on file    Attends religious service: Not on file    Active member of club or organization: Not on file    Attends meetings of clubs or organizations: Not on file    Relationship status: Not on file  . Intimate partner violence    Fear of current or ex partner: Not on file    Emotionally abused: Not on file    Physically abused: Not on file    Forced sexual activity: Not on file  Other Topics Concern  . Not on file  Social History Narrative  . Not on file   ROS  Review of Systems  Constitution: Negative for chills, decreased  appetite, malaise/fatigue and weight gain.  Cardiovascular: Positive for dyspnea on exertion (mild and stable). Negative for leg swelling and syncope.  Endocrine: Negative for cold intolerance.  Hematologic/Lymphatic: Does not bruise/bleed easily.  Musculoskeletal: Negative for joint swelling.  Gastrointestinal: Negative for abdominal pain, anorexia, change in bowel habit, hematochezia and melena.  Neurological: Negative for headaches and light-headedness.  Psychiatric/Behavioral: Negative for depression and substance abuse.  All other systems reviewed and are negative.  Objective  Blood pressure (!) 160/100, pulse 64, temperature (!) 97.1 F (36.2 C), height '5\' 8"'$  (1.727 m), weight 158 lb 12.8 oz (72 kg), SpO2 97 %. Body mass index is 24.15 kg/m.   Physical Exam  Constitutional: He appears well-developed and well-nourished.  No distress.  HENT:  Head: Atraumatic.  Eyes: Conjunctivae are normal.  Neck: Neck supple. No JVD present. No thyromegaly present.  Cardiovascular: Intact distal pulses and normal pulses. An irregularly irregular rhythm present. Exam reveals no gallop, no S3 and no S4.  Murmur heard.  Crescendo-decrescendo mid to late systolic murmur is present with a grade of 3/6 at the apex. S1 is variable, S2 is normal.   Pulmonary/Chest: Effort normal and breath sounds normal.  Abdominal: Soft. Bowel sounds are normal.  Musculoskeletal: Normal range of motion.        General: No edema.  Neurological: He is alert.  Skin: Skin is warm and dry.  Psychiatric: He has a normal mood and affect.   Radiology: No results found.  Laboratory examination:   06/25/2018: Creatinine 1.2, EGFR 58/67, potassium 4.2, BMP otherwise normal.  No results for input(s): NA, K, CL, CO2, GLUCOSE, BUN, CREATININE, CALCIUM, GFRNONAA, GFRAA in the last 8760 hours. CMP Latest Ref Rng & Units 09/27/2015 07/30/2013 02/09/2013  Glucose 70 - 99 mg/dL 102(H) 107(H) 123(H)  BUN 6 - 23 mg/dL '21 11 12  '$ Creatinine 0.40 - 1.50 mg/dL 0.87 0.75 0.83  Sodium 135 - 145 mEq/L 135 131(L) 132(L)  Potassium 3.5 - 5.1 mEq/L 4.0 4.2 3.7  Chloride 96 - 112 mEq/L 102 97 95(L)  CO2 19 - 32 mEq/L '27 22 22  '$ Calcium 8.4 - 10.5 mg/dL 9.4 9.1 9.8  Total Protein 6.0 - 8.3 g/dL - 7.0 -  Total Bilirubin 0.3 - 1.2 mg/dL - 0.5 -  Alkaline Phos 39 - 117 U/L - 53 -  AST 0 - 37 U/L - 19 -  ALT 0 - 53 U/L - 17 -   CBC Latest Ref Rng & Units 07/30/2013 02/09/2013  WBC 4.0 - 10.5 K/uL 7.6 10.0  Hemoglobin 13.0 - 17.0 g/dL 15.6 15.8  Hematocrit 39.0 - 52.0 % 42.3 43.8  Platelets 150 - 400 K/uL 295 333   Lipid Panel  No results found for: CHOL, TRIG, HDL, CHOLHDL, VLDL, LDLCALC, LDLDIRECT HEMOGLOBIN A1C No results found for: HGBA1C, MPG TSH No results for input(s): TSH in the last 8760 hours. Medications   Prior to Admission medications   Medication  Sig Start Date End Date Taking? Authorizing Provider  atorvastatin (LIPITOR) 10 MG tablet TAKE 1 TABLET DAILY 12/14/18   Adrian Prows, MD  ELIQUIS 5 MG TABS tablet Take 5 mg by mouth 2 (two) times daily. 05/02/18   [provider]  omeprazole (PRILOSEC) 20 MG capsule Take 1 capsule (20 mg total) by mouth daily. 03/17/18   Armbruster, Carlota Raspberry, MD  sertraline (ZOLOFT) 100 MG tablet Take 100 mg by mouth daily.    [provider]  valsartan-hydrochlorothiazide (DIOVAN-HCT) 160-12.5 MG tablet TAKE 1 TABLET EVERY  MORNING 12/14/18   Adrian Prows, MD     Current Outpatient Medications  Medication Instructions  . amLODipine (NORVASC) 10 mg, Oral, Daily  . amLODipine (NORVASC) 10 mg, Oral, Daily  . atorvastatin (LIPITOR) 10 MG tablet TAKE 1 TABLET DAILY  . Eliquis 5 mg, Oral, 2 times daily  . omeprazole (PRILOSEC) 20 mg, Oral, Daily  . sertraline (ZOLOFT) 100 mg, Oral, Daily  . valsartan-hydrochlorothiazide (DIOVAN-HCT) 160-12.5 MG tablet TAKE 1 TABLET EVERY MORNING    Cardiac Studies:   Direct current cardioversion 06/30/2018: 100J A. Fib to S. Bradycardia. Occasional Mobitz II AV Block.  Exercise myoview stress 06/08/2018: 1. The patient performed treadmill exercise using Bruce protocol, completing 5:30 minutes. The patient completed an estimated workload of 7 METS, reaching 105% of the maximum predicted heart rate. Resting hypertension 160/98 mmHg with normal hemodynamic response. No stress symptoms reported. Low exercise capacity. No ischemic changes seen on stress electrocardiogram.  2. The overall quality of the study is good. There is no evidence of abnormal lung activity.  Decreased uptake seen in entire myocardium on short axis stress images likely due to tissue attenuation. Otherwise, stress and rest SPECT images demonstrate homogeneous tracer distribution throughout the myocardium. Gated SPECT imaging reveals normal myocardial thickening and wall motion. The left ventricular  ejection fraction was normal (66%).   3. Low risk study.  Echocardiogram 05/03/2019: Left ventricle cavity is normal in size. Normal left ventricular wall thickness. Normal LV systolic function with EF 71%. Normal global wall motion. Indeterminate diastolic filling pattern.  Severe biatrial dilatation. Moderate (Grade III) mitral regurgitation. Moderate to severe tricuspid regurgitation. Moderate pulmonary hypertension. Estimated pulmonary artery systolic pressure is 39 mmHg. No significant change compared to previous study on 11/03/2018.   Assessment     ICD-10-CM   1. Moderate to severe mitral regurgitation  I34.0 PCV ECHOCARDIOGRAM COMPLETE  2. Mitral valve prolapse  I34.1 PCV ECHOCARDIOGRAM COMPLETE  3. Permanent atrial fibrillation  I48.21 EKG 12-Lead   CHA2DS2-VASc Score is 3.  Yearly risk of stroke: 3.2%.   4. Essential hypertension  I10 amLODipine (NORVASC) 10 MG tablet    amLODipine (NORVASC) 10 MG tablet    EKG 05/12/2019: Atrial  fibrillation with controlled ventricular response at the rate of 80 bpm, normal axis, nonspecific T abnormality.  Single interpolated PVC. No significant change from  EKG 08/12/2018  Recommendations:   Patient is essentially asymptomatic with regard to mitral regurgitation.  No significant change in his physical examination.  Blood pressure still continues to be uncontrolled, although patient states that he has white coat hypertension.  I'll start him on amlodipine 10 mg daily.  I'll like to see him back in 6 weeks.  He does not have routine labs for almost a year now but he has a complete physical examination is scheduled with Dr. Dorthy Cooler in Nov 2020.  I'll like to see him back in 6 weeks for follow-up of hypertension.  I have again discussed with him regarding the signs and symptoms of acute mitral regurgitation, symptoms associated with valvular heart disease including dyspnea, PND and orthopnea and pulmonary edema.  With regard to atrial  fibrillation, it is rate controlled.  He could not tolerate beta blockers due to marked fatigue and also sleep disturbance.He is presently on anticoagulation with Eliquis, continue the same.  No bleeding diathesis.  Adrian Prows, MD, [facility] 05/12/2019, 11:56 AM [facility]. [place] Pager: [phone] Office: [phone] If no answer Cell [phone]

## 2019-06-01 ENCOUNTER — Other Ambulatory Visit: Payer: Self-pay | Admitting: Gastroenterology

## 2019-06-02 ENCOUNTER — Other Ambulatory Visit: Payer: Self-pay

## 2019-06-02 ENCOUNTER — Ambulatory Visit (INDEPENDENT_AMBULATORY_CARE_PROVIDER_SITE_OTHER): Payer: Medicare HMO

## 2019-06-02 DIAGNOSIS — I341 Nonrheumatic mitral (valve) prolapse: Secondary | ICD-10-CM

## 2019-06-02 DIAGNOSIS — I340 Nonrheumatic mitral (valve) insufficiency: Secondary | ICD-10-CM

## 2019-06-24 ENCOUNTER — Encounter: Payer: Self-pay | Admitting: Cardiology

## 2019-06-24 ENCOUNTER — Other Ambulatory Visit: Payer: Self-pay

## 2019-06-24 ENCOUNTER — Ambulatory Visit: Payer: Medicare HMO | Admitting: Cardiology

## 2019-06-24 VITALS — BP 156/96 | HR 65 | Ht 68.0 in | Wt 160.2 lb

## 2019-06-24 DIAGNOSIS — I071 Rheumatic tricuspid insufficiency: Secondary | ICD-10-CM

## 2019-06-24 DIAGNOSIS — Z298 Encounter for other specified prophylactic measures: Secondary | ICD-10-CM | POA: Diagnosis not present

## 2019-06-24 DIAGNOSIS — I340 Nonrheumatic mitral (valve) insufficiency: Secondary | ICD-10-CM | POA: Diagnosis not present

## 2019-06-24 DIAGNOSIS — I4821 Permanent atrial fibrillation: Secondary | ICD-10-CM

## 2019-06-24 DIAGNOSIS — I341 Nonrheumatic mitral (valve) prolapse: Secondary | ICD-10-CM

## 2019-06-24 NOTE — Progress Notes (Signed)
Primary Physician/Referring:  Lujean Amel, MD  Patient ID: Douglas Fritz, male    DOB: 23-Sep-1941, 78 y.o.   MRN: [id number]  Chief Complaint  Patient presents with  . Atrial Fibrillation  . Follow-up   HPI:    Douglas Fritz  is a 78 y.o. Caucasian male with history of hypertension, prostate cancer, Barrett's esophagus with new onset atrial fibrillation that was noted during colonoscopy/endoscopy on 04/05/18.   Patient was seen by me 6 weeks ago for uncontrolled hypertension, I had added 10 mg of amlodipine.  He brings his blood pressure recordings, states that he is tolerating all his medications well and essentially asymptomatic.  He also underwent echocardiogram to follow-up on moderate to moderately severe mitral regurgitation and tricuspid regurgitation. No leg edema, no palpitations, no chest pain.   Past Medical History:  Diagnosis Date  . Anxiety   . Barrett's esophagus without dysplasia dx 2016  . Colon polyps   . Depression   . Dysphagia 03/03/2015  . ED (erectile dysfunction)   . H. pylori infection   . Hypertension   . Melanoma ([place])   . Prostate cancer ([place])   . Prostate cancer ([place])   . Pulmonary nodules   . Skin cancer    melanoma   Past Surgical History:  Procedure Laterality Date  . CARDIOVERSION N/A 06/30/2018   Procedure: CARDIOVERSION;  Surgeon: Adrian Prows, MD;  Location: [place];  Service: Cardiovascular;  Laterality: N/A;  . [place]  . KNEE SURGERY Right   . MELANOMA EXCISION  11/2012  . TEE WITHOUT CARDIOVERSION N/A 05/22/2018   Procedure: TRANSESOPHAGEAL ECHOCARDIOGRAM (TEE);  Surgeon: Adrian Prows, MD;  Location: [facility];  Service: Cardiovascular;  Laterality: N/A;   Social History   Socioeconomic History  . Marital status: Married    Spouse name: Not on file  . Number of children: 3  . Years of education: Not on file  . Highest education level: Not on file  Occupational History  . Occupation: retired   Scientific laboratory technician  . Financial resource strain: Not on file  . Food insecurity    Worry: Not on file    Inability: Not on file  . Transportation needs    Medical: Not on file    Non-medical: Not on file  Tobacco Use  . Smoking status: Never Smoker  . Smokeless tobacco: Never Used  Substance and Sexual Activity  . Alcohol use: Yes    Alcohol/week: 7.0 standard drinks    Types: 7 Cans of beer per week    Comment: SOCIAL  . Drug use: No  . Sexual activity: Not on file  Lifestyle  . Physical activity    Days per week: Not on file    Minutes per session: Not on file  . Stress: Not on file  Relationships  . Social Herbalist on phone: Not on file    Gets together: Not on file    Attends religious service: Not on file    Active member of club or organization: Not on file    Attends meetings of clubs or organizations: Not on file    Relationship status: Not on file  . Intimate partner violence    Fear of current or ex partner: Not on file    Emotionally abused: Not on file    Physically abused: Not on file    Forced sexual activity: Not on file  Other Topics Concern  . Not on file  Social History Narrative  . Not on file   ROS  Review of Systems  Constitution: Negative for chills, decreased appetite, malaise/fatigue and weight gain.  Cardiovascular: Positive for dyspnea on exertion (mild and stable). Negative for leg swelling and syncope.  Endocrine: Negative for cold intolerance.  Hematologic/Lymphatic: Does not bruise/bleed easily.  Musculoskeletal: Negative for joint swelling.  Gastrointestinal: Negative for abdominal pain, anorexia, change in bowel habit, hematochezia and melena.  Neurological: Negative for headaches and light-headedness.  Psychiatric/Behavioral: Negative for depression and substance abuse.  All other systems reviewed and are negative.  Objective   Vitals with BMI 06/24/2019 05/12/2019 11/13/2018  Height _0  _1  _2   Weight 160 lbs 3 oz  158 lbs 13 oz 159 lbs 8 oz  BMI 24.36 14.10 30.13  Systolic 143 888 757  Diastolic 96 972 83  Pulse 65 64 74    Physical Exam  Constitutional: He appears well-developed and well-nourished. No distress.  HENT:  Head: Atraumatic.  Eyes: Conjunctivae are normal.  Neck: Neck supple. No JVD present. No thyromegaly present.  Cardiovascular: Intact distal pulses and normal pulses. An irregularly irregular rhythm present. Exam reveals no gallop, no S3 and no S4.  Murmur heard.  Crescendo-decrescendo mid to late systolic murmur is present with a grade of 3/6 at the apex. S1 is variable, S2 is normal.   Pulmonary/Chest: Effort normal and breath sounds normal.  Abdominal: Soft. Bowel sounds are normal.  Musculoskeletal: Normal range of motion.        General: No edema.  Neurological: He is alert.  Skin: Skin is warm and dry.  Psychiatric: He has a normal mood and affect.   Radiology: No results found.  Laboratory examination:   06/25/2018: Creatinine 1.2, EGFR 58/67, potassium 4.2, BMP otherwise normal.  No results for input(s): NA, K, CL, CO2, GLUCOSE, BUN, CREATININE, CALCIUM, GFRNONAA, GFRAA in the last 8760 hours. CMP Latest Ref Rng & Units 09/27/2015 07/30/2013 02/09/2013  Glucose 70 - 99 mg/dL 102(H) 107(H) 123(H)  BUN 6 - 23 mg/dL _3 Creatinine 0.40 - 1.50 mg/dL 0.87 0.75 0.83  Sodium 135 - 145 mEq/L 135 131(L) 132(L)  Potassium 3.5 - 5.1 mEq/L 4.0 4.2 3.7  Chloride 96 - 112 mEq/L 102 97 95(L)  CO2 19 - 32 mEq/L _4 Calcium 8.4 - 10.5 mg/dL 9.4 9.1 9.8  Total Protein 6.0 - 8.3 g/dL - 7.0 -  Total Bilirubin 0.3 - 1.2 mg/dL - 0.5 -  Alkaline Phos 39 - 117 U/L - 53 -  AST 0 - 37 U/L - 19 -  ALT 0 - 53 U/L - 17 -   CBC Latest Ref Rng & Units 07/30/2013 02/09/2013  WBC 4.0 - 10.5 K/uL 7.6 10.0  Hemoglobin 13.0 - 17.0 g/dL 15.6 15.8  Hematocrit 39.0 - 52.0 % 42.3 43.8  Platelets 150 - 400 K/uL 295 333   Lipid Panel  No results found for: CHOL, TRIG, HDL, CHOLHDL,  VLDL, LDLCALC, LDLDIRECT HEMOGLOBIN A1C No results found for: HGBA1C, MPG TSH No results for input(s): TSH in the last 8760 hours. Medications   Prior to Admission medications   Medication Sig Start Date End Date Taking? Authorizing Provider  atorvastatin (LIPITOR) 10 MG tablet TAKE 1 TABLET DAILY 12/14/18   Adrian Prows, MD  ELIQUIS 5 MG TABS tablet Take 5 mg by mouth 2 (two) times daily. 05/02/18   [provider]  omeprazole (PRILOSEC) 20 MG capsule Take 1 capsule (20 mg total)  by mouth daily. 03/17/18   Armbruster, Carlota Raspberry, MD  sertraline (ZOLOFT) 100 MG tablet Take 100 mg by mouth daily.    [provider]  valsartan-hydrochlorothiazide (DIOVAN-HCT) 160-12.5 MG tablet TAKE 1 TABLET EVERY MORNING 12/14/18   Adrian Prows, MD     Current Outpatient Medications  Medication Instructions  . amLODipine (NORVASC) 10 mg, Oral, Daily  . atorvastatin (LIPITOR) 10 MG tablet TAKE 1 TABLET DAILY  . Eliquis 5 mg, Oral, 2 times daily  . omeprazole (PRILOSEC) 20 mg, Oral, Daily, Please schedule a yearly follow up appointment for further refills. Thank you.  . sertraline (ZOLOFT) 100 mg, Oral, Daily  . valsartan-hydrochlorothiazide (DIOVAN-HCT) 160-12.5 MG tablet TAKE 1 TABLET EVERY MORNING    Cardiac Studies:   Direct current cardioversion 06/30/2018: 100J A. Fib to S. Bradycardia. Occasional Mobitz II AV Block.  Exercise myoview stress 06/08/2018: 1. The patient performed treadmill exercise using Bruce protocol, completing 5:30 minutes. The patient completed an estimated workload of 7 METS, reaching 105% of the maximum predicted heart rate. Resting hypertension 160/98 mmHg with normal hemodynamic response. No stress symptoms reported. Low exercise capacity. No ischemic changes seen on stress electrocardiogram.  2. The overall quality of the study is good. There is no evidence of abnormal lung activity.  Decreased uptake seen in entire myocardium on short axis stress images likely due  to tissue attenuation. Otherwise, stress and rest SPECT images demonstrate homogeneous tracer distribution throughout the myocardium. Gated SPECT imaging reveals normal myocardial thickening and wall motion. The left ventricular ejection fraction was normal (66%).   3. Low risk study.  Echocardiogram 06/02/2019: Left ventricle cavity is normal in size. Normal left ventricular wall thickness. Normal LV systolic function with EF 68%. Normal global wall motion. Unable to evaluate diastolic function due to atrial fibrillation.  Left atrial cavity is severely dilated. Right atrial cavity is moderately dilated. Bileaflet mitral valve prolapse with moderate (Grade III) mitral regurgitation. Moderate tricuspid regurgitation. Mild pulmonary hypertension. Estimated pulmonary artery systolic pressure is 40 mmHg.  No significant change compared to recent study on 05/03/2019.  Assessment     ICD-10-CM   1. Mitral valve prolapse  I34.1   2. Moderate mitral regurgitation  I34.0   3. Moderate tricuspid regurgitation  I07.1   4. SBE (subacute bacterial endocarditis) prophylaxis candidate  Z29.8   5. Permanent atrial fibrillation (HCC)  I48.21    CHA2DS2-VASc Score is 3.  Yearly risk of stroke: 3.2%.(Age and hypertension)    EKG 05/12/2019: Atrial  fibrillation with controlled ventricular response at the rate of 80 bpm, normal axis, nonspecific T abnormality.  Single interpolated PVC. No significant change from  EKG 08/12/2018  Recommendations:   Patient is essentially asymptomatic with regard to mitral regurgitation and tricuspid regurgitation.  No significant change in his physical examination.  Blood pressure still continues to be uncontrolled, although patient  Brings home recordings which are perfect.  He has white coat hypertension.  I will see him back in 1 year unless dyspnea or leg edema. He is tolerating Eliquis for anticoagulation for permanent atrial fibrillation.   I have again discussed with  him regarding the signs and symptoms of acute mitral regurgitation, symptoms associated with valvular heart disease including dyspnea, PND and orthopnea and pulmonary edema.     Adrian Prows, MD, [facility] 06/24/2019, 11:10 AM [facility]. [place] Pager: [phone] Office: [phone] If no answer Cell [phone]

## 2019-06-28 ENCOUNTER — Other Ambulatory Visit: Payer: Self-pay | Admitting: Cardiology

## 2019-07-12 DIAGNOSIS — L570 Actinic keratosis: Secondary | ICD-10-CM | POA: Diagnosis not present

## 2019-07-12 DIAGNOSIS — L821 Other seborrheic keratosis: Secondary | ICD-10-CM | POA: Diagnosis not present

## 2019-07-12 DIAGNOSIS — L812 Freckles: Secondary | ICD-10-CM | POA: Diagnosis not present

## 2019-07-12 DIAGNOSIS — C44712 Basal cell carcinoma of skin of right lower limb, including hip: Secondary | ICD-10-CM | POA: Diagnosis not present

## 2019-07-12 DIAGNOSIS — Z85828 Personal history of other malignant neoplasm of skin: Secondary | ICD-10-CM | POA: Diagnosis not present

## 2019-07-12 DIAGNOSIS — D485 Neoplasm of uncertain behavior of skin: Secondary | ICD-10-CM | POA: Diagnosis not present

## 2019-07-14 DIAGNOSIS — R69 Illness, unspecified: Secondary | ICD-10-CM | POA: Diagnosis not present

## 2019-08-05 DIAGNOSIS — I482 Chronic atrial fibrillation, unspecified: Secondary | ICD-10-CM | POA: Diagnosis not present

## 2019-08-05 DIAGNOSIS — Z79899 Other long term (current) drug therapy: Secondary | ICD-10-CM | POA: Diagnosis not present

## 2019-08-05 DIAGNOSIS — E7800 Pure hypercholesterolemia, unspecified: Secondary | ICD-10-CM | POA: Diagnosis not present

## 2019-08-05 DIAGNOSIS — R7303 Prediabetes: Secondary | ICD-10-CM | POA: Diagnosis not present

## 2019-08-05 DIAGNOSIS — I10 Essential (primary) hypertension: Secondary | ICD-10-CM | POA: Diagnosis not present

## 2019-08-05 DIAGNOSIS — R69 Illness, unspecified: Secondary | ICD-10-CM | POA: Diagnosis not present

## 2019-08-08 ENCOUNTER — Other Ambulatory Visit: Payer: Self-pay | Admitting: Gastroenterology

## 2019-08-11 ENCOUNTER — Other Ambulatory Visit: Payer: Self-pay

## 2019-08-11 MED ORDER — ELIQUIS 5 MG PO TABS: 5.0000 mg | ORAL_TABLET | Freq: Two times a day (BID) | ORAL | 1 refills | Status: DC

## 2019-08-12 ENCOUNTER — Other Ambulatory Visit: Payer: Self-pay

## 2019-08-12 DIAGNOSIS — I341 Nonrheumatic mitral (valve) prolapse: Secondary | ICD-10-CM

## 2019-08-12 MED ORDER — ELIQUIS 5 MG PO TABS: 5.0000 mg | ORAL_TABLET | Freq: Two times a day (BID) | ORAL | 3 refills | Status: DC

## 2019-11-10 DIAGNOSIS — L821 Other seborrheic keratosis: Secondary | ICD-10-CM | POA: Diagnosis not present

## 2019-11-10 DIAGNOSIS — L308 Other specified dermatitis: Secondary | ICD-10-CM | POA: Diagnosis not present

## 2019-11-10 DIAGNOSIS — L812 Freckles: Secondary | ICD-10-CM | POA: Diagnosis not present

## 2019-11-10 DIAGNOSIS — D225 Melanocytic nevi of trunk: Secondary | ICD-10-CM | POA: Diagnosis not present

## 2019-11-10 DIAGNOSIS — D2272 Melanocytic nevi of left lower limb, including hip: Secondary | ICD-10-CM | POA: Diagnosis not present

## 2019-11-10 DIAGNOSIS — L570 Actinic keratosis: Secondary | ICD-10-CM | POA: Diagnosis not present

## 2019-11-10 DIAGNOSIS — Z85828 Personal history of other malignant neoplasm of skin: Secondary | ICD-10-CM | POA: Diagnosis not present

## 2019-11-29 ENCOUNTER — Other Ambulatory Visit: Payer: Self-pay | Admitting: Cardiology

## 2019-11-29 DIAGNOSIS — I10 Essential (primary) hypertension: Secondary | ICD-10-CM

## 2019-12-27 ENCOUNTER — Other Ambulatory Visit: Payer: Self-pay | Admitting: Cardiology

## 2019-12-27 ENCOUNTER — Telehealth: Payer: Self-pay

## 2019-12-27 DIAGNOSIS — I340 Nonrheumatic mitral (valve) insufficiency: Secondary | ICD-10-CM

## 2019-12-27 DIAGNOSIS — Z298 Encounter for other specified prophylactic measures: Secondary | ICD-10-CM

## 2019-12-27 MED ORDER — AMOXICILLIN 500 MG PO CAPS: 2000.0000 mg | ORAL_CAPSULE | Freq: Once | ORAL | 4 refills | Status: DC | PRN

## 2019-12-27 NOTE — Addendum Note (Signed)
Addended by: Kela Millin on: 12/27/2019 11:21 PM   Modules accepted: Orders

## 2019-12-27 NOTE — Telephone Encounter (Signed)
Spoke with patient via telephone and patient states he is having dental work in the near future and wanted to know if you can refill a RX of Amoxicillin. Can this be filled? Please advise. Thanks!

## 2020-01-10 ENCOUNTER — Other Ambulatory Visit: Payer: Self-pay | Admitting: Cardiology

## 2020-02-04 DIAGNOSIS — H9193 Unspecified hearing loss, bilateral: Secondary | ICD-10-CM | POA: Diagnosis not present

## 2020-02-04 DIAGNOSIS — R7301 Impaired fasting glucose: Secondary | ICD-10-CM | POA: Diagnosis not present

## 2020-02-04 DIAGNOSIS — I10 Essential (primary) hypertension: Secondary | ICD-10-CM | POA: Diagnosis not present

## 2020-02-04 DIAGNOSIS — R7309 Other abnormal glucose: Secondary | ICD-10-CM | POA: Diagnosis not present

## 2020-02-04 DIAGNOSIS — Z79899 Other long term (current) drug therapy: Secondary | ICD-10-CM | POA: Diagnosis not present

## 2020-02-04 DIAGNOSIS — E7800 Pure hypercholesterolemia, unspecified: Secondary | ICD-10-CM | POA: Diagnosis not present

## 2020-02-04 DIAGNOSIS — I480 Paroxysmal atrial fibrillation: Secondary | ICD-10-CM | POA: Diagnosis not present

## 2020-02-04 DIAGNOSIS — K2270 Barrett's esophagus without dysplasia: Secondary | ICD-10-CM | POA: Diagnosis not present

## 2020-02-04 DIAGNOSIS — Z0001 Encounter for general adult medical examination with abnormal findings: Secondary | ICD-10-CM | POA: Diagnosis not present

## 2020-02-04 DIAGNOSIS — R69 Illness, unspecified: Secondary | ICD-10-CM | POA: Diagnosis not present

## 2020-03-20 DIAGNOSIS — H9313 Tinnitus, bilateral: Secondary | ICD-10-CM | POA: Diagnosis not present

## 2020-03-20 DIAGNOSIS — H903 Sensorineural hearing loss, bilateral: Secondary | ICD-10-CM | POA: Diagnosis not present

## 2020-03-30 DIAGNOSIS — D225 Melanocytic nevi of trunk: Secondary | ICD-10-CM | POA: Diagnosis not present

## 2020-03-30 DIAGNOSIS — L570 Actinic keratosis: Secondary | ICD-10-CM | POA: Diagnosis not present

## 2020-03-30 DIAGNOSIS — L820 Inflamed seborrheic keratosis: Secondary | ICD-10-CM | POA: Diagnosis not present

## 2020-03-30 DIAGNOSIS — C4441 Basal cell carcinoma of skin of scalp and neck: Secondary | ICD-10-CM | POA: Diagnosis not present

## 2020-03-30 DIAGNOSIS — L821 Other seborrheic keratosis: Secondary | ICD-10-CM | POA: Diagnosis not present

## 2020-03-30 DIAGNOSIS — Z85828 Personal history of other malignant neoplasm of skin: Secondary | ICD-10-CM | POA: Diagnosis not present

## 2020-03-30 DIAGNOSIS — L812 Freckles: Secondary | ICD-10-CM | POA: Diagnosis not present

## 2020-03-30 DIAGNOSIS — D485 Neoplasm of uncertain behavior of skin: Secondary | ICD-10-CM | POA: Diagnosis not present

## 2020-05-08 DIAGNOSIS — M542 Cervicalgia: Secondary | ICD-10-CM | POA: Diagnosis not present

## 2020-05-08 DIAGNOSIS — M5030 Other cervical disc degeneration, unspecified cervical region: Secondary | ICD-10-CM | POA: Diagnosis not present

## 2020-05-28 ENCOUNTER — Other Ambulatory Visit: Payer: Self-pay | Admitting: Cardiology

## 2020-05-28 DIAGNOSIS — I10 Essential (primary) hypertension: Secondary | ICD-10-CM

## 2020-06-23 ENCOUNTER — Ambulatory Visit: Payer: Medicare HMO | Admitting: Cardiology

## 2020-06-23 ENCOUNTER — Other Ambulatory Visit: Payer: Self-pay

## 2020-06-23 ENCOUNTER — Encounter: Payer: Self-pay | Admitting: Cardiology

## 2020-06-23 VITALS — BP 132/80 | HR 85 | Resp 16 | Ht 68.0 in | Wt 158.0 lb

## 2020-06-23 DIAGNOSIS — I4821 Permanent atrial fibrillation: Secondary | ICD-10-CM

## 2020-06-23 DIAGNOSIS — I071 Rheumatic tricuspid insufficiency: Secondary | ICD-10-CM

## 2020-06-23 DIAGNOSIS — I340 Nonrheumatic mitral (valve) insufficiency: Secondary | ICD-10-CM | POA: Diagnosis not present

## 2020-06-23 DIAGNOSIS — I10 Essential (primary) hypertension: Secondary | ICD-10-CM

## 2020-06-23 NOTE — Progress Notes (Signed)
Primary Physician/Referring:  Lujean Amel, MD  Patient ID: Douglas Fritz, male    DOB: Jan 19, 1941, 79 y.o.   MRN: [id number]  Chief Complaint  Patient presents with  . Mitral Valve Prolapse  . Atrial Fibrillation  . Follow-up    1 year    HPI:    Douglas Fritz  is a 79 y.o. Caucasian male with history of hypertension, prostate cancer, Barrett's esophagus with new onset atrial fibrillation that was noted during colonoscopy/endoscopy on 04/05/18. He is followed in our clinic for mitral regurgitation and tricuspid regurgitation as well.   The patient presents for annual follow up visit. He is overall doing very well. He monitors his blood pressure at home and it has been well controlled. He reports he occasionally has the sensation of an irregular heart beat, but doesn't find this bothersome. He denies any chest pain, dyspnea, leg swelling, palpitations, orthopnea. Denies abnormal bleeding. He is tolerating his medications well without side effects.   Past Medical History:  Diagnosis Date  . Anxiety   . Barrett's esophagus without dysplasia dx 2016  . Colon polyps   . Depression   . Dysphagia 03/03/2015  . ED (erectile dysfunction)   . H. pylori infection   . Hypertension   . Melanoma ([place])   . Prostate cancer ([place])   . Prostate cancer ([place])   . Pulmonary nodules   . Skin cancer    melanoma   Past Surgical History:  Procedure Laterality Date  . CARDIOVERSION N/A 06/30/2018   Procedure: CARDIOVERSION;  Surgeon: Adrian Prows, MD;  Location: [place];  Service: Cardiovascular;  Laterality: N/A;  . [place]  . KNEE SURGERY Right   . MELANOMA EXCISION  11/2012  . TEE WITHOUT CARDIOVERSION N/A 05/22/2018   Procedure: TRANSESOPHAGEAL ECHOCARDIOGRAM (TEE);  Surgeon: Adrian Prows, MD;  Location: [facility];  Service: Cardiovascular;  Laterality: N/A;    Social History   Tobacco Use  . Smoking status: Never Smoker  . Smokeless tobacco: Never Used   Substance Use Topics  . Alcohol use: Yes    Alcohol/week: 7.0 standard drinks    Types: 7 Cans of beer per week    Comment: SOCIAL   Marital Status: Married  ROS  Review of Systems  Cardiovascular: Positive for irregular heartbeat (occasional). Negative for chest pain, dyspnea on exertion, leg swelling, near-syncope, palpitations and paroxysmal nocturnal dyspnea.   Objective   Vitals with BMI 06/23/2020 06/24/2019 05/12/2019  Height _0  _1  _2   Weight 158 lbs 160 lbs 3 oz 158 lbs 13 oz  BMI 24.03 60.73 71.06  Systolic 269 485 462  Diastolic 80 96 703  Pulse 85 65 64    Physical Exam Constitutional:      General: He is not in acute distress.    Appearance: He is well-developed.  HENT:     Head: Atraumatic.  Neck:     Vascular: No JVD.  Cardiovascular:     Rate and Rhythm: Normal rate. Rhythm irregularly irregular.     Pulses: Normal pulses and intact distal pulses. A midsystolic click.     Heart sounds: Murmur heard. High-pitched blowing holosystolic murmur is present with a grade of 3/6 at the apex.     No gallop. No S3 or S4 sounds.      Comments: S1 is variable, S2 is normal.  Pulmonary:     Effort: Pulmonary effort is normal.     Breath sounds: Normal breath sounds.  Abdominal:  General: Bowel sounds are normal.     Palpations: Abdomen is soft.    Radiology: No results found.  Laboratory examination:   No results for input(s): NA, K, CL, CO2, GLUCOSE, BUN, CREATININE, CALCIUM, GFRNONAA, GFRAA in the last 8760 hours. CMP Latest Ref Rng & Units 09/27/2015 07/30/2013 02/09/2013  Glucose 70 - 99 mg/dL 102(H) 107(H) 123(H)  BUN 6 - 23 mg/dL _0 Creatinine 0.40 - 1.50 mg/dL 0.87 0.75 0.83  Sodium 135 - 145 mEq/L 135 131(L) 132(L)  Potassium 3.5 - 5.1 mEq/L 4.0 4.2 3.7  Chloride 96 - 112 mEq/L 102 97 95(L)  CO2 19 - 32 mEq/L _1 Calcium 8.4 - 10.5 mg/dL 9.4 9.1 9.8  Total Protein 6.0 - 8.3 g/dL - 7.0 -  Total Bilirubin 0.3 - 1.2 mg/dL - 0.5  -  Alkaline Phos 39 - 117 U/L - 53 -  AST 0 - 37 U/L - 19 -  ALT 0 - 53 U/L - 17 -   CBC Latest Ref Rng & Units 07/30/2013 02/09/2013  WBC 4.0 - 10.5 K/uL 7.6 10.0  Hemoglobin 13.0 - 17.0 g/dL 15.6 15.8  Hematocrit 39 - 52 % 42.3 43.8  Platelets 150 - 400 K/uL 295 333   Lipid Panel  No results found for: CHOL, TRIG, HDL, CHOLHDL, VLDL, LDLCALC, LDLDIRECT   HEMOGLOBIN A1C No results found for: HGBA1C, MPG   TSH No results for input(s): TSH in the last 8760 hours.   External Labs:  Cholesterol, total 105.000 02/04/2020 HDL 40.000  02/04/2020 LDL-C 55.000 02/04/2020 Triglycerides 41.000 02/04/2020  A1C 6.300  02/04/2020  Hemoglobin 14.600 G/05/19/2018  Creatinine, Serum 0.950 02/04/2020 Potassium 4.100 02/04/2020 ALT (SGPT) 22.000 02/04/2020  06/25/2018: Creatinine 1.2, EGFR 58/67, potassium 4.2, BMP otherwise normal.  Medications   Allergies  Allergen Reactions  . Bee Venom Hives and Swelling   Current Outpatient Medications on File Prior to Visit  Medication Sig Dispense Refill  . amLODipine (NORVASC) 10 MG tablet TAKE 1 TABLET DAILY 90 tablet 0  . atorvastatin (LIPITOR) 10 MG tablet TAKE 1 TABLET DAILY 90 tablet 1  . ELIQUIS 5 MG TABS tablet Take 1 tablet (5 mg total) by mouth 2 (two) times daily. 180 tablet 3  . omeprazole (PRILOSEC) 20 MG capsule Take 1 capsule (20 mg total) by mouth daily. Please schedule a yearly follow up appointment for further refills. Thank you. 30 capsule 1  . valsartan-hydrochlorothiazide (DIOVAN-HCT) 160-12.5 MG tablet TAKE 1 TABLET EVERY MORNING 90 tablet 2   No current facility-administered medications on file prior to visit.    Cardiac Studies:   Direct current cardioversion 06/30/2018: 100J A. Fib to S. Bradycardia. Occasional Mobitz II AV Block.  Exercise myoview stress 06/08/2018: 1. The patient performed treadmill exercise using Bruce protocol, completing 5:30 minutes. The patient completed an estimated workload of 7 METS, reaching  105% of the maximum predicted heart rate. Resting hypertension 160/98 mmHg with normal hemodynamic response. No stress symptoms reported. Low exercise capacity. No ischemic changes seen on stress electrocardiogram.  2. The overall quality of the study is good. There is no evidence of abnormal lung activity.  Decreased uptake seen in entire myocardium on short axis stress images likely due to tissue attenuation. Otherwise, stress and rest SPECT images demonstrate homogeneous tracer distribution throughout the myocardium. Gated SPECT imaging reveals normal myocardial thickening and wall motion. The left ventricular ejection fraction was normal (66%).   3. Low risk study.  Echocardiogram 06/02/2019: Left ventricle cavity  is normal in size. Normal left ventricular wall thickness. Normal LV systolic function with EF 68%. Normal global wall motion. Unable to evaluate diastolic function due to atrial fibrillation.  Left atrial cavity is severely dilated. Right atrial cavity is moderately dilated. Bileaflet mitral valve prolapse with moderate (Grade III) mitral regurgitation. Moderate tricuspid regurgitation. Mild pulmonary hypertension. Estimated pulmonary artery systolic pressure is 40 mmHg.  No significant change compared to recent study on 05/03/2019.  EKG   EKG 06/23/2020: Atrial fibrillation with controlled ventricular response at rate of 74 bpm, normal axis, incomplete right bundle branch block.  Nonspecific T abnormality.    EKG 05/12/2019: Atrial  fibrillation with controlled ventricular response at the rate of 80 bpm, normal axis, nonspecific T abnormality.  Single interpolated PVC. No significant change from  EKG 08/12/2018  Assessment     ICD-10-CM   1. Moderate to severe mitral regurgitation  I34.0 EKG 12-Lead    PCV ECHOCARDIOGRAM COMPLETE  2. Moderate tricuspid regurgitation  I07.1   3. Permanent atrial fibrillation (HCC)  I48.21 PCV ECHOCARDIOGRAM COMPLETE  4. Essential hypertension   I10     No orders of the defined types were placed in this encounter.  Medications Discontinued During This Encounter  Medication Reason  . amoxicillin (AMOXIL) 500 MG capsule Patient Preference  . sertraline (ZOLOFT) 100 MG tablet Discontinued by provider   Recommendations:   Douglas Fritz is a 79 y.o. Caucasian male with history of hypertension, prostate cancer, Barrett's esophagus with new onset atrial fibrillation that was noted during colonoscopy/endoscopy on 04/05/18. He is followed in our clinic for mitral regurgitation and tricuspid regurgitation as well.  The patient presents for annual follow up visit. He is doing well overall. With regard to permanent atrial fibrillation, he occasionally has the sensation of an irregular heart beat, but does not find this bothersome. His heart rate is well controlled. He is on eliquis and tolerating without bleeding diathesis  The patient is essentially asymptomatic with regard to mitral regurgitation and tricuspid regurgitation. There is no clinical evidence of heart failure. I will repeat an echocardiogram.   The patients blood pressure is elevated initially in clinic, however this improved upon recheck. His home recordings are very well controlled. He is presently on amlodipine and valsartan-HCT. I reviewed his external labs. Lipids are well controlled. No anemia. Normal renal function.   I will see him back in six months unless he has issues then, sooner. Discussed he should watch for dyspnea, orthopnea, edema.  Blair Heys, PA Student 06/23/20  10:50 AM   Patient seen and examined in conjunction with Blair Heys, PA second year student at [employer].  Time spent is in direct patient face to face encounter not including the teaching and training involved.    Adrian Prows, MD, [facility] 06/24/2020, 5:20 PM Office: [phone]

## 2020-06-26 ENCOUNTER — Other Ambulatory Visit: Payer: Self-pay | Admitting: Cardiology

## 2020-06-26 DIAGNOSIS — I10 Essential (primary) hypertension: Secondary | ICD-10-CM

## 2020-07-02 DIAGNOSIS — R69 Illness, unspecified: Secondary | ICD-10-CM | POA: Diagnosis not present

## 2020-08-02 DIAGNOSIS — D225 Melanocytic nevi of trunk: Secondary | ICD-10-CM | POA: Diagnosis not present

## 2020-08-02 DIAGNOSIS — L812 Freckles: Secondary | ICD-10-CM | POA: Diagnosis not present

## 2020-08-02 DIAGNOSIS — Z85828 Personal history of other malignant neoplasm of skin: Secondary | ICD-10-CM | POA: Diagnosis not present

## 2020-08-02 DIAGNOSIS — L570 Actinic keratosis: Secondary | ICD-10-CM | POA: Diagnosis not present

## 2020-08-02 DIAGNOSIS — C44219 Basal cell carcinoma of skin of left ear and external auricular canal: Secondary | ICD-10-CM | POA: Diagnosis not present

## 2020-08-02 DIAGNOSIS — L821 Other seborrheic keratosis: Secondary | ICD-10-CM | POA: Diagnosis not present

## 2020-08-02 DIAGNOSIS — D485 Neoplasm of uncertain behavior of skin: Secondary | ICD-10-CM | POA: Diagnosis not present

## 2020-08-07 DIAGNOSIS — I10 Essential (primary) hypertension: Secondary | ICD-10-CM | POA: Diagnosis not present

## 2020-08-07 DIAGNOSIS — I480 Paroxysmal atrial fibrillation: Secondary | ICD-10-CM | POA: Diagnosis not present

## 2020-08-07 DIAGNOSIS — E7800 Pure hypercholesterolemia, unspecified: Secondary | ICD-10-CM | POA: Diagnosis not present

## 2020-08-07 DIAGNOSIS — Z79899 Other long term (current) drug therapy: Secondary | ICD-10-CM | POA: Diagnosis not present

## 2020-08-07 DIAGNOSIS — R69 Illness, unspecified: Secondary | ICD-10-CM | POA: Diagnosis not present

## 2020-08-07 DIAGNOSIS — R7309 Other abnormal glucose: Secondary | ICD-10-CM | POA: Diagnosis not present

## 2020-10-05 DIAGNOSIS — M542 Cervicalgia: Secondary | ICD-10-CM | POA: Diagnosis not present

## 2020-10-13 ENCOUNTER — Other Ambulatory Visit: Payer: Self-pay

## 2020-10-13 MED ORDER — VALSARTAN-HYDROCHLOROTHIAZIDE 160-12.5 MG PO TABS: 1.0000 | ORAL_TABLET | Freq: Every morning | ORAL | 2 refills | Status: DC

## 2020-11-06 ENCOUNTER — Other Ambulatory Visit: Payer: Self-pay

## 2020-11-06 DIAGNOSIS — I341 Nonrheumatic mitral (valve) prolapse: Secondary | ICD-10-CM

## 2020-11-06 MED ORDER — ELIQUIS 5 MG PO TABS: 5.0000 mg | ORAL_TABLET | Freq: Two times a day (BID) | ORAL | 3 refills | Status: DC

## 2020-11-28 ENCOUNTER — Other Ambulatory Visit: Payer: Self-pay

## 2020-11-28 DIAGNOSIS — I10 Essential (primary) hypertension: Secondary | ICD-10-CM

## 2020-11-28 MED ORDER — AMLODIPINE BESYLATE 10 MG PO TABS: 10.0000 mg | ORAL_TABLET | Freq: Every day | ORAL | 0 refills | Status: DC

## 2020-11-29 ENCOUNTER — Other Ambulatory Visit: Payer: Self-pay

## 2020-11-29 DIAGNOSIS — I10 Essential (primary) hypertension: Secondary | ICD-10-CM

## 2020-11-29 MED ORDER — AMLODIPINE BESYLATE 10 MG PO TABS: 10.0000 mg | ORAL_TABLET | Freq: Every day | ORAL | 0 refills | Status: DC

## 2020-12-05 DIAGNOSIS — D225 Melanocytic nevi of trunk: Secondary | ICD-10-CM | POA: Diagnosis not present

## 2020-12-05 DIAGNOSIS — Z85828 Personal history of other malignant neoplasm of skin: Secondary | ICD-10-CM | POA: Diagnosis not present

## 2020-12-05 DIAGNOSIS — D2272 Melanocytic nevi of left lower limb, including hip: Secondary | ICD-10-CM | POA: Diagnosis not present

## 2020-12-05 DIAGNOSIS — L570 Actinic keratosis: Secondary | ICD-10-CM | POA: Diagnosis not present

## 2020-12-05 DIAGNOSIS — D485 Neoplasm of uncertain behavior of skin: Secondary | ICD-10-CM | POA: Diagnosis not present

## 2020-12-05 DIAGNOSIS — L812 Freckles: Secondary | ICD-10-CM | POA: Diagnosis not present

## 2020-12-05 DIAGNOSIS — Z85820 Personal history of malignant melanoma of skin: Secondary | ICD-10-CM | POA: Diagnosis not present

## 2020-12-05 DIAGNOSIS — L821 Other seborrheic keratosis: Secondary | ICD-10-CM | POA: Diagnosis not present

## 2020-12-05 DIAGNOSIS — D2271 Melanocytic nevi of right lower limb, including hip: Secondary | ICD-10-CM | POA: Diagnosis not present

## 2020-12-18 ENCOUNTER — Ambulatory Visit: Payer: Medicare HMO

## 2020-12-18 ENCOUNTER — Other Ambulatory Visit: Payer: Self-pay

## 2020-12-18 DIAGNOSIS — I340 Nonrheumatic mitral (valve) insufficiency: Secondary | ICD-10-CM

## 2020-12-18 DIAGNOSIS — I4821 Permanent atrial fibrillation: Secondary | ICD-10-CM

## 2020-12-19 ENCOUNTER — Other Ambulatory Visit: Payer: Self-pay

## 2020-12-19 MED ORDER — ATORVASTATIN CALCIUM 10 MG PO TABS: 10.0000 mg | ORAL_TABLET | Freq: Every day | ORAL | 1 refills | Status: DC

## 2020-12-22 ENCOUNTER — Other Ambulatory Visit: Payer: Self-pay

## 2020-12-22 ENCOUNTER — Encounter: Payer: Self-pay | Admitting: Cardiology

## 2020-12-22 ENCOUNTER — Ambulatory Visit: Payer: Medicare HMO | Admitting: Cardiology

## 2020-12-22 VITALS — BP 135/80 | HR 72 | Temp 98.2°F | Resp 16 | Ht 68.0 in | Wt 160.4 lb

## 2020-12-22 DIAGNOSIS — I341 Nonrheumatic mitral (valve) prolapse: Secondary | ICD-10-CM | POA: Diagnosis not present

## 2020-12-22 DIAGNOSIS — I071 Rheumatic tricuspid insufficiency: Secondary | ICD-10-CM | POA: Diagnosis not present

## 2020-12-22 DIAGNOSIS — I4821 Permanent atrial fibrillation: Secondary | ICD-10-CM | POA: Diagnosis not present

## 2020-12-22 NOTE — Progress Notes (Signed)
Primary Physician/Referring:  Lujean Amel, MD  Patient ID: Douglas Fritz, male    DOB: Dec 04, 1940, 80 y.o.   MRN: [id number]  Chief Complaint  Patient presents with  . Atrial Fibrillation  . Mitral Regurgitation  . Follow-up    6 month   HPI:    Douglas Fritz  is a 80 y.o. Caucasian male with history of hypertension, prostate cancer, Barrett's esophagus with new onset atrial fibrillation that was noted during colonoscopy/endoscopy on 04/05/18. He is followed in our clinic for mitral regurgitation and tricuspid regurgitation as well.   The patient presents for annual follow up visit. He is overall doing very well. He monitors his blood pressure at home and it has been well controlled.  He denies any chest pain, dyspnea, leg swelling, palpitations, orthopnea. Denies abnormal bleeding. He is tolerating his medications well without side effects.   Past Medical History:  Diagnosis Date  . Anxiety   . Barrett's esophagus without dysplasia dx 2016  . Colon polyps   . Depression   . Dysphagia 03/03/2015  . ED (erectile dysfunction)   . H. pylori infection   . Hypertension   . Melanoma ([place])   . Prostate cancer ([place])   . Prostate cancer ([place])   . Pulmonary nodules   . Skin cancer    melanoma   Past Surgical History:  Procedure Laterality Date  . CARDIOVERSION N/A 06/30/2018   Procedure: CARDIOVERSION;  Surgeon: Adrian Prows, MD;  Location: [facility] [facility];  Service: Cardiovascular;  Laterality: N/A;  . [place]  . KNEE SURGERY Right   . MELANOMA EXCISION  11/2012  . TEE WITHOUT CARDIOVERSION N/A 05/22/2018   Procedure: TRANSESOPHAGEAL ECHOCARDIOGRAM (TEE);  Surgeon: Adrian Prows, MD;  Location: [facility] ENDOSCOPY;  Service: Cardiovascular;  Laterality: N/A;    Social History   Tobacco Use  . Smoking status: Never Smoker  . Smokeless tobacco: Never Used  Substance Use Topics  . Alcohol use: Yes    Alcohol/week: 7.0 standard drinks    Types: 7 Cans of beer per  week    Comment: SOCIAL   Marital Status: Married  ROS  Review of Systems  Cardiovascular: Positive for irregular heartbeat (occasional). Negative for chest pain, dyspnea on exertion, leg swelling, near-syncope, palpitations and paroxysmal nocturnal dyspnea.   Objective   Vitals with BMI 12/22/2020 06/23/2020 06/24/2019  Height _0  _1  _2   Weight 160 lbs 6 oz 158 lbs 160 lbs 3 oz  BMI 24.39 44.31 54.00  Systolic 867 619 509  Diastolic 80 80 96  Pulse 72 85 65    Physical Exam Constitutional:      General: He is not in acute distress.    Appearance: He is well-developed.  HENT:     Head: Atraumatic.  Neck:     Vascular: No JVD.  Cardiovascular:     Rate and Rhythm: Normal rate. Rhythm irregularly irregular.     Pulses: Normal pulses and intact distal pulses. A midsystolic click.     Heart sounds: Murmur heard.  High-pitched blowing holosystolic murmur is present with a grade of 3/6 at the apex.    No gallop. No S3 or S4 sounds.      Comments: S1 is variable, S2 is normal.  Pulmonary:     Effort: Pulmonary effort is normal.     Breath sounds: Normal breath sounds.  Abdominal:     General: Bowel sounds are normal.     Palpations: Abdomen is soft.  Radiology: No results found.  Laboratory examination:   External Labs:   Cholesterol, total 105.000 02/04/2020 HDL 40.000  02/04/2020 LDL-C 55.000 02/04/2020 Triglycerides 41.000 02/04/2020  A1C 6.300  02/04/2020  Hemoglobin 14.600 G/05/19/2018  Creatinine, Serum 0.950 02/04/2020 Potassium 4.100 02/04/2020 ALT (SGPT) 22.000 02/04/2020  06/25/2018: Creatinine 1.2, EGFR 58/67, potassium 4.2, BMP otherwise normal.  Medications   Allergies  Allergen Reactions  . Bee Venom Hives and Swelling   Current Outpatient Medications on File Prior to Visit  Medication Sig Dispense Refill  . amLODipine (NORVASC) 10 MG tablet Take 1 tablet (10 mg total) by mouth daily. 90 tablet 0  . atorvastatin (LIPITOR) 10 MG tablet  Take 1 tablet (10 mg total) by mouth daily. 90 tablet 1  . ELIQUIS 5 MG TABS tablet Take 1 tablet (5 mg total) by mouth 2 (two) times daily. 180 tablet 3  . valsartan-hydrochlorothiazide (DIOVAN-HCT) 160-12.5 MG tablet Take 1 tablet by mouth every morning. 90 tablet 2  . sertraline (ZOLOFT) 100 MG tablet Take 100 mg by mouth daily.     No current facility-administered medications on file prior to visit.    Cardiac Studies:   Direct current cardioversion 06/30/2018: 100J A. Fib to S. Bradycardia. Occasional Mobitz II AV Block.  Exercise myoview stress 06/08/2018: 1. The patient performed treadmill exercise using Bruce protocol, completing 5:30 minutes. The patient completed an estimated workload of 7 METS, reaching 105% of the maximum predicted heart rate. Resting hypertension 160/98 mmHg with normal hemodynamic response. No stress symptoms reported. Low exercise capacity. No ischemic changes seen on stress electrocardiogram.  2. The overall quality of the study is good. There is no evidence of abnormal lung activity.  Decreased uptake seen in entire myocardium on short axis stress images likely due to tissue attenuation. Otherwise, stress and rest SPECT images demonstrate homogeneous tracer distribution throughout the myocardium. Gated SPECT imaging reveals normal myocardial thickening and wall motion. The left ventricular ejection fraction was normal (66%).   3. Low risk study.  Echocardiogram 12/18/2020:   Normal LV systolic function with EF 59%. Left ventricle cavity is normal in size. Normal global wall motion. Indeterminate diastolic filling pattern due to atrial fibrillation. Calculated EF 59%. Left atrial cavity is moderate to severely dilated at 4.8 cm. Right atrial cavity is moderately dilated. Right ventricle cavity is borderline dilated. Normal right ventricular function. No evidence of mitral stenosis.  Mild prolapse of the mitral valve leaflets. Moderate (Grade II) posteriorly  directed mitral regurgitation. Structurally normal tricuspid valve. No evidence of tricuspid stenosis. Moderate to severe eccenteric towards atrial septum tricuspid regurgitation. PA pressure could be underestimated due to eccentric jet.  Mild pulmonary hypertension. RVSP measures 35 mmHg. IVC is dilated with respiratory variation. CVP 8 mm Hg.  Compared to the prior study done on 06/02/2019, no significant change.    EKG     EKG 12/22/2020: Atrial fibrillation with controlled ventricular response at rate of 67 bpm, normal axis, incomplete right bundle branch block.  Nonspecific T abnormality.  Single PVC.  No significant change from EKG 06/23/2020   Assessment     ICD-10-CM   1. Permanent atrial fibrillation (HCC)  I48.21 EKG 12-Lead  2. Moderate tricuspid regurgitation  I07.1 PCV ECHOCARDIOGRAM COMPLETE  3. Mitral valve prolapse  I34.1 PCV ECHOCARDIOGRAM COMPLETE    No orders of the defined types were placed in this encounter.  Medications Discontinued During This Encounter  Medication Reason  . omeprazole (PRILOSEC) 20 MG capsule Error   Recommendations:   Douglas Piccolo  Fritz is a 80 y.o. Caucasian male with history of hypertension, prostate cancer, Barrett's esophagus with new onset atrial fibrillation that was noted during colonoscopy/endoscopy on 04/05/18. He is followed in our clinic for mitral regurgitation and tricuspid regurgitation as well.  The patient presents for annual follow up visit. He is doing well overall. With regard to permanent atrial fibrillation, he occasionally has the sensation of an irregular heart beat, but does not find this bothersome. His heart rate is well controlled. He is on eliquis and tolerating without bleeding diathesis  The patient is essentially asymptomatic with regard to mitral regurgitation and tricuspid regurgitation. There is no clinical evidence of heart failure.  In view of borderline RV dilatation, he may be coming closer to mitral and tricuspid valve  repair, but he is completely asymptomatic.  Advised him extensively regarding calling us back right away if he notices leg edema, dyspnea or reduced exercise tolerance.  Otherwise I will repeat an echocardiogram in 6 months and see him back then.  Blood pressures well controlled, lipids are being managed by his PCP.  No changes in the medications were done today by me.    Adrian Prows, MD, [facility] 12/22/2020, 9:01 AM Office: [phone]

## 2021-02-08 ENCOUNTER — Other Ambulatory Visit: Payer: Self-pay

## 2021-02-08 DIAGNOSIS — I10 Essential (primary) hypertension: Secondary | ICD-10-CM

## 2021-02-08 MED ORDER — AMOXICILLIN 500 MG PO CAPS: 500.0000 mg | ORAL_CAPSULE | ORAL | 0 refills | Status: DC | PRN

## 2021-02-08 MED ORDER — AMLODIPINE BESYLATE 10 MG PO TABS: 10.0000 mg | ORAL_TABLET | Freq: Every day | ORAL | 1 refills | Status: DC

## 2021-02-12 DIAGNOSIS — R69 Illness, unspecified: Secondary | ICD-10-CM | POA: Diagnosis not present

## 2021-02-12 DIAGNOSIS — D6869 Other thrombophilia: Secondary | ICD-10-CM | POA: Diagnosis not present

## 2021-02-12 DIAGNOSIS — Z0001 Encounter for general adult medical examination with abnormal findings: Secondary | ICD-10-CM | POA: Diagnosis not present

## 2021-02-12 DIAGNOSIS — Z79899 Other long term (current) drug therapy: Secondary | ICD-10-CM | POA: Diagnosis not present

## 2021-02-12 DIAGNOSIS — Z8546 Personal history of malignant neoplasm of prostate: Secondary | ICD-10-CM | POA: Diagnosis not present

## 2021-02-12 DIAGNOSIS — R7301 Impaired fasting glucose: Secondary | ICD-10-CM | POA: Diagnosis not present

## 2021-02-12 DIAGNOSIS — I10 Essential (primary) hypertension: Secondary | ICD-10-CM | POA: Diagnosis not present

## 2021-02-12 DIAGNOSIS — K2270 Barrett's esophagus without dysplasia: Secondary | ICD-10-CM | POA: Diagnosis not present

## 2021-02-12 DIAGNOSIS — E7800 Pure hypercholesterolemia, unspecified: Secondary | ICD-10-CM | POA: Diagnosis not present

## 2021-02-12 DIAGNOSIS — I480 Paroxysmal atrial fibrillation: Secondary | ICD-10-CM | POA: Diagnosis not present

## 2021-03-09 DIAGNOSIS — J069 Acute upper respiratory infection, unspecified: Secondary | ICD-10-CM | POA: Diagnosis not present

## 2021-04-09 ENCOUNTER — Other Ambulatory Visit: Payer: Self-pay | Admitting: Cardiology

## 2021-04-10 DIAGNOSIS — Z85828 Personal history of other malignant neoplasm of skin: Secondary | ICD-10-CM | POA: Diagnosis not present

## 2021-04-10 DIAGNOSIS — L812 Freckles: Secondary | ICD-10-CM | POA: Diagnosis not present

## 2021-04-10 DIAGNOSIS — Z85820 Personal history of malignant melanoma of skin: Secondary | ICD-10-CM | POA: Diagnosis not present

## 2021-04-10 DIAGNOSIS — D225 Melanocytic nevi of trunk: Secondary | ICD-10-CM | POA: Diagnosis not present

## 2021-04-10 DIAGNOSIS — D2271 Melanocytic nevi of right lower limb, including hip: Secondary | ICD-10-CM | POA: Diagnosis not present

## 2021-04-10 DIAGNOSIS — L821 Other seborrheic keratosis: Secondary | ICD-10-CM | POA: Diagnosis not present

## 2021-04-10 DIAGNOSIS — D2272 Melanocytic nevi of left lower limb, including hip: Secondary | ICD-10-CM | POA: Diagnosis not present

## 2021-05-11 ENCOUNTER — Other Ambulatory Visit: Payer: Self-pay | Admitting: Cardiology

## 2021-05-15 DIAGNOSIS — H524 Presbyopia: Secondary | ICD-10-CM | POA: Diagnosis not present

## 2021-05-15 DIAGNOSIS — H25813 Combined forms of age-related cataract, bilateral: Secondary | ICD-10-CM | POA: Diagnosis not present

## 2021-05-15 DIAGNOSIS — H52223 Regular astigmatism, bilateral: Secondary | ICD-10-CM | POA: Diagnosis not present

## 2021-05-16 DIAGNOSIS — Z0100 Encounter for examination of eyes and vision without abnormal findings: Secondary | ICD-10-CM | POA: Diagnosis not present

## 2021-06-03 ENCOUNTER — Encounter: Payer: Self-pay | Admitting: Gastroenterology

## 2021-06-25 ENCOUNTER — Other Ambulatory Visit: Payer: Medicare HMO

## 2021-06-26 ENCOUNTER — Ambulatory Visit: Payer: PPO

## 2021-06-26 ENCOUNTER — Other Ambulatory Visit: Payer: Self-pay

## 2021-06-26 DIAGNOSIS — I341 Nonrheumatic mitral (valve) prolapse: Secondary | ICD-10-CM

## 2021-06-26 DIAGNOSIS — I071 Rheumatic tricuspid insufficiency: Secondary | ICD-10-CM | POA: Diagnosis not present

## 2021-07-04 ENCOUNTER — Ambulatory Visit: Payer: PPO | Admitting: Cardiology

## 2021-07-04 ENCOUNTER — Ambulatory Visit: Payer: Medicare HMO | Admitting: Cardiology

## 2021-07-27 DIAGNOSIS — M1711 Unilateral primary osteoarthritis, right knee: Secondary | ICD-10-CM | POA: Diagnosis not present

## 2021-07-27 DIAGNOSIS — M25561 Pain in right knee: Secondary | ICD-10-CM | POA: Diagnosis not present

## 2021-07-30 ENCOUNTER — Ambulatory Visit: Payer: PPO | Admitting: Cardiology

## 2021-07-31 DIAGNOSIS — M25561 Pain in right knee: Secondary | ICD-10-CM | POA: Diagnosis not present

## 2021-08-06 ENCOUNTER — Other Ambulatory Visit: Payer: Self-pay

## 2021-08-06 ENCOUNTER — Encounter: Payer: Self-pay | Admitting: Cardiology

## 2021-08-06 ENCOUNTER — Ambulatory Visit: Payer: PPO | Admitting: Cardiology

## 2021-08-06 VITALS — BP 130/76 | HR 75 | Temp 97.8°F | Resp 16 | Ht 68.0 in | Wt 167.6 lb

## 2021-08-06 DIAGNOSIS — I340 Nonrheumatic mitral (valve) insufficiency: Secondary | ICD-10-CM

## 2021-08-06 DIAGNOSIS — I071 Rheumatic tricuspid insufficiency: Secondary | ICD-10-CM | POA: Diagnosis not present

## 2021-08-06 DIAGNOSIS — I10 Essential (primary) hypertension: Secondary | ICD-10-CM | POA: Diagnosis not present

## 2021-08-06 DIAGNOSIS — I4821 Permanent atrial fibrillation: Secondary | ICD-10-CM | POA: Diagnosis not present

## 2021-08-06 NOTE — Progress Notes (Signed)
Primary Physician/Referring:  Lujean Amel, MD  Patient ID: Douglas Fritz, male    DOB: 12-22-1940, 80 y.o.   MRN: [id number]  Chief Complaint  Patient presents with   Mitral Regurgitation   Tricuspid Regurgitation   Atrial Fibrillation   Follow-up    6 months    HPI:    Douglas Fritz  is a 80 y.o. Caucasian male with history of hypertension, prostate cancer, Barrett's esophagus with new onset atrial fibrillation that was noted during colonoscopy/endoscopy on 04/05/18. He is followed in our clinic for mitral regurgitation and tricuspid regurgitation as well.   The patient presents for 81-monthfollow up visit. He is overall doing very well. He monitors his blood pressure at home and it has been well controlled.  He denies any chest pain, dyspnea, leg swelling, palpitations, orthopnea. Denies abnormal bleeding. He is tolerating his medications well without side effects.   Past Medical History:  Diagnosis Date   Anxiety    Barrett's esophagus without dysplasia dx 2016   Colon polyps    Depression    Dysphagia 03/03/2015   ED (erectile dysfunction)    H. pylori infection    Hypertension    [place]    [place]    [place]    Pulmonary nodules    Skin cancer    melanoma   Past Surgical History:  Procedure Laterality Date   CARDIOVERSION N/A 06/30/2018   Procedure: CARDIOVERSION;  Surgeon: GAdrian Prows MD;  [facility]  Service: Cardiovascular;  Laterality: N/A;   [place]    MELANOMA EXCISION  11/2012   TEE WITHOUT CARDIOVERSION N/A 05/22/2018   Procedure: TRANSESOPHAGEAL ECHOCARDIOGRAM (TEE);  Surgeon: GAdrian Prows MD;  [facility]  Service: Cardiovascular;  Laterality: N/A;    Social History   Tobacco Use   Smoking status: Never   Smokeless tobacco: Never  Substance Use Topics   Alcohol use: Yes    Alcohol/week: 7.0 standard drinks    Types: 7 Cans of beer per week     Comment: SOCIAL   Marital Status: Married  ROS  Review of Systems  Cardiovascular:  Positive for irregular heartbeat (occasional). Negative for chest pain, dyspnea on exertion, leg swelling, near-syncope, palpitations and paroxysmal nocturnal dyspnea.  Objective   Vitals with BMI 08/06/2021 12/22/2020 06/23/2020  Height _0  _1  _2   Weight 167 lbs 10 oz 160 lbs 6 oz 158 lbs  BMI 25.49 209.98233.82 Systolic [id number] Diastolic 76 80 80  Pulse 75 72 85    Physical Exam Constitutional:      General: He is not in acute distress.    Appearance: He is well-developed.  HENT:     Head: Atraumatic.  Neck:     Vascular: No JVD.  Cardiovascular:     Rate and Rhythm: Normal rate. Rhythm irregularly irregular.     Pulses: Normal pulses and intact distal pulses.     Heart sounds: A midsystolic click. Murmur heard.  High-pitched blowing holosystolic murmur is present with a grade of 3/6 at the apex.      No gallop. No S3 or S4 sounds.     Comments: S1 is variable, S2 is normal.  Pulmonary:     Effort: Pulmonary effort is normal.     Breath sounds: Normal breath sounds.  Abdominal:     General: Bowel sounds are normal.     Palpations:  Abdomen is soft.   Radiology: No results found.  Laboratory examination:   External Labs:  Cholesterol, total 119.000 m 02/12/2021 HDL 46.000 mg 02/12/2021 LDL 62.000 mg 02/12/2021 Triglycerides 50.000 mg 02/12/2021  A1C 6.100 % 08/07/2020  Hemoglobin 14.600 G/ 05/19/2018  Creatinine, Serum 0.900 mg/ 02/12/2021 Potassium 4.500 mm 02/12/2021 ALT (SGPT) 21.000 U/L 02/12/2021  Cholesterol, total 105.000 02/04/2020 HDL 40.000  02/04/2020 LDL-C 55.000 02/04/2020 Triglycerides 41.000 02/04/2020  A1C 6.300  02/04/2020  Hemoglobin 14.600 G/05/19/2018  Creatinine, Serum 0.950 02/04/2020 Potassium 4.100 02/04/2020 ALT (SGPT) 22.000 02/04/2020  06/25/2018: Creatinine 1.2, EGFR 58/67, potassium 4.2, BMP otherwise normal.  Medications    Allergies  Allergen Reactions   Bee Venom Hives and Swelling   Current Outpatient Medications on File Prior to Visit  Medication Sig Dispense Refill   amLODipine (NORVASC) 10 MG tablet Take 1 tablet (10 mg total) by mouth daily. 90 tablet 1   atorvastatin (LIPITOR) 10 MG tablet TAKE 1 TABLET DAILY 90 tablet 1   ELIQUIS 5 MG TABS tablet Take 1 tablet (5 mg total) by mouth 2 (two) times daily. 180 tablet 3   sertraline (ZOLOFT) 100 MG tablet Take 100 mg by mouth daily.     valsartan-hydrochlorothiazide (DIOVAN-HCT) 160-12.5 MG tablet TAKE 1 TABLET EVERY MORNING 90 tablet 2   No current facility-administered medications on file prior to visit.    Cardiac Studies:   Direct current cardioversion 06/30/2018: 100J A. Fib to S. Bradycardia. Occasional Mobitz II AV Block.  Exercise myoview stress 06/08/2018: 1. The patient performed treadmill exercise using Bruce protocol, completing 5:30 minutes. The patient completed an estimated workload of 7 METS, reaching 105% of the maximum predicted heart rate. Resting hypertension 160/98 mmHg with normal hemodynamic response. No stress symptoms reported. Low exercise capacity. No ischemic changes seen on stress electrocardiogram.  2. The overall quality of the study is good. There is no evidence of abnormal lung activity.  Decreased uptake seen in entire myocardium on short axis stress images likely due to tissue attenuation. Otherwise, stress and rest SPECT images demonstrate homogeneous tracer distribution throughout the myocardium. Gated SPECT imaging reveals normal myocardial thickening and wall motion. The left ventricular ejection fraction was normal (66%).   3. Low risk study.  PCV ECHOCARDIOGRAM COMPLETE 06/26/2021  Narrative Echocardiogram 06/26/2021: Left ventricle cavity is normal in size. Mild concentric hypertrophy of the left ventricle. Normal global wall motion. Normal LV systolic function with EF 56%. Diastolic function not assessed due  to severity of mitral regurgitation. Left atrial cavity is moderately dilated. Right atrial cavity is severely dilated. Right ventricle cavity is moderately dilated. Normal right ventricular function. Mild prolapse of the mitral valve leaflets.  Moderate (Grade III) mitral regurgitation. Severe tricuspid regurgitation. Estimated pulmonary artery systolic pressure 33 mmHg. Moderate pulmonic regurgitation. On comparing images form previous study on 12/18/2020, there is no significant change noted.  EKG   EKG 08/06/2021: Atrial fibrillation with controlled ventricular response at rate of 62 bpm, leftward axis, incomplete right bundle branch block.  Nonspecific ST-T abnormality.  No significant change from 12/22/2020.   Assessment     ICD-10-CM   1. Permanent atrial fibrillation (HCC)  I48.21 EKG 12-Lead    2. Moderate to severe mitral regurgitation  I34.0 PCV ECHOCARDIOGRAM COMPLETE    3. Moderate tricuspid regurgitation  I07.1 PCV ECHOCARDIOGRAM COMPLETE    4. Essential hypertension  I10       No orders of the defined types were placed in this encounter.  Medications Discontinued During This Encounter  Medication Reason   amoxicillin (AMOXIL) 500 MG capsule Error   Recommendations:   Jajuan Skoog is a 80 y.o. Caucasian male with history of hypertension, prostate cancer, Barrett's esophagus with new onset atrial fibrillation that was noted during colonoscopy/endoscopy on 04/05/18. He is followed in our clinic for mitral regurgitation and tricuspid regurgitation as well.  The patient presents for 35-monthfollow up visit. He is doing well overall, remains asymptomatic and is tolerating anticoagulation without bleeding diathesis.  Except for mild trace ankle edema on the right secondary to trauma on his right knee from a recent fall 10 days ago, his physical examination is unremarkable and unchanged from previous with no change in mitral regurgitation murmur.  There is no JVD and lungs are  clear.  External labs reviewed, renal function has remained stable and lipids are well controlled.  Blood pressure is also very well controlled.  I will repeat his echocardiogram in 6 months, I will see him back in 1 year for follow-up unless the echocardiogram reveals significant abnormality or patient develops any symptoms of heart failure.  Symptoms of heart failure is well aware with the patient.  It was a pleasure to see him this morning.    JAdrian Prows MD, [facility]11/14/2022, 9:24 AM Office: [phone]

## 2021-08-09 DIAGNOSIS — M1711 Unilateral primary osteoarthritis, right knee: Secondary | ICD-10-CM | POA: Diagnosis not present

## 2021-08-14 DIAGNOSIS — D2262 Melanocytic nevi of left upper limb, including shoulder: Secondary | ICD-10-CM | POA: Diagnosis not present

## 2021-08-14 DIAGNOSIS — L570 Actinic keratosis: Secondary | ICD-10-CM | POA: Diagnosis not present

## 2021-08-14 DIAGNOSIS — L821 Other seborrheic keratosis: Secondary | ICD-10-CM | POA: Diagnosis not present

## 2021-08-14 DIAGNOSIS — D2271 Melanocytic nevi of right lower limb, including hip: Secondary | ICD-10-CM | POA: Diagnosis not present

## 2021-08-14 DIAGNOSIS — D2261 Melanocytic nevi of right upper limb, including shoulder: Secondary | ICD-10-CM | POA: Diagnosis not present

## 2021-08-14 DIAGNOSIS — L853 Xerosis cutis: Secondary | ICD-10-CM | POA: Diagnosis not present

## 2021-08-14 DIAGNOSIS — D225 Melanocytic nevi of trunk: Secondary | ICD-10-CM | POA: Diagnosis not present

## 2021-08-14 DIAGNOSIS — L298 Other pruritus: Secondary | ICD-10-CM | POA: Diagnosis not present

## 2021-08-14 DIAGNOSIS — D2272 Melanocytic nevi of left lower limb, including hip: Secondary | ICD-10-CM | POA: Diagnosis not present

## 2021-08-14 DIAGNOSIS — Z85828 Personal history of other malignant neoplasm of skin: Secondary | ICD-10-CM | POA: Diagnosis not present

## 2021-08-21 ENCOUNTER — Other Ambulatory Visit: Payer: Self-pay | Admitting: Cardiology

## 2021-08-21 DIAGNOSIS — I10 Essential (primary) hypertension: Secondary | ICD-10-CM

## 2021-11-02 ENCOUNTER — Telehealth: Payer: Self-pay | Admitting: Cardiology

## 2021-11-02 ENCOUNTER — Other Ambulatory Visit: Payer: Self-pay

## 2021-11-02 DIAGNOSIS — I341 Nonrheumatic mitral (valve) prolapse: Secondary | ICD-10-CM

## 2021-11-02 DIAGNOSIS — I10 Essential (primary) hypertension: Secondary | ICD-10-CM

## 2021-11-02 MED ORDER — AMLODIPINE BESYLATE 10 MG PO TABS: 10.0000 mg | ORAL_TABLET | Freq: Every day | ORAL | 2 refills | Status: DC

## 2021-11-02 MED ORDER — ELIQUIS 5 MG PO TABS: 5.0000 mg | ORAL_TABLET | Freq: Two times a day (BID) | ORAL | 2 refills | Status: DC

## 2021-11-02 MED ORDER — VALSARTAN-HYDROCHLOROTHIAZIDE 160-12.5 MG PO TABS: 1.0000 | ORAL_TABLET | Freq: Every morning | ORAL | 2 refills | Status: DC

## 2021-11-02 MED ORDER — ATORVASTATIN CALCIUM 10 MG PO TABS: 10.0000 mg | ORAL_TABLET | Freq: Every day | ORAL | 2 refills | Status: DC

## 2021-11-02 NOTE — Telephone Encounter (Signed)
Patient says he called a few days ago, asking to discuss medications with JG's MA. He changed insurance providers so he needed to discuss meds with MA. Please call him back at [phone].

## 2021-12-11 DIAGNOSIS — L111 Transient acantholytic dermatosis [Grover]: Secondary | ICD-10-CM | POA: Diagnosis not present

## 2021-12-11 DIAGNOSIS — D2271 Melanocytic nevi of right lower limb, including hip: Secondary | ICD-10-CM | POA: Diagnosis not present

## 2021-12-11 DIAGNOSIS — L812 Freckles: Secondary | ICD-10-CM | POA: Diagnosis not present

## 2021-12-11 DIAGNOSIS — L821 Other seborrheic keratosis: Secondary | ICD-10-CM | POA: Diagnosis not present

## 2021-12-11 DIAGNOSIS — D485 Neoplasm of uncertain behavior of skin: Secondary | ICD-10-CM | POA: Diagnosis not present

## 2021-12-11 DIAGNOSIS — D2272 Melanocytic nevi of left lower limb, including hip: Secondary | ICD-10-CM | POA: Diagnosis not present

## 2021-12-11 DIAGNOSIS — D225 Melanocytic nevi of trunk: Secondary | ICD-10-CM | POA: Diagnosis not present

## 2021-12-11 DIAGNOSIS — C44519 Basal cell carcinoma of skin of other part of trunk: Secondary | ICD-10-CM | POA: Diagnosis not present

## 2021-12-11 DIAGNOSIS — D692 Other nonthrombocytopenic purpura: Secondary | ICD-10-CM | POA: Diagnosis not present

## 2021-12-11 DIAGNOSIS — Z85828 Personal history of other malignant neoplasm of skin: Secondary | ICD-10-CM | POA: Diagnosis not present

## 2022-01-28 NOTE — Telephone Encounter (Signed)
From patient.

## 2022-02-04 ENCOUNTER — Other Ambulatory Visit: Payer: PPO

## 2022-02-05 ENCOUNTER — Ambulatory Visit: Payer: PPO

## 2022-02-05 DIAGNOSIS — I071 Rheumatic tricuspid insufficiency: Secondary | ICD-10-CM

## 2022-02-05 DIAGNOSIS — I340 Nonrheumatic mitral (valve) insufficiency: Secondary | ICD-10-CM

## 2022-02-21 DIAGNOSIS — K2270 Barrett's esophagus without dysplasia: Secondary | ICD-10-CM | POA: Diagnosis not present

## 2022-02-21 DIAGNOSIS — Z0000 Encounter for general adult medical examination without abnormal findings: Secondary | ICD-10-CM | POA: Diagnosis not present

## 2022-02-21 DIAGNOSIS — I10 Essential (primary) hypertension: Secondary | ICD-10-CM | POA: Diagnosis not present

## 2022-02-21 DIAGNOSIS — D6869 Other thrombophilia: Secondary | ICD-10-CM | POA: Diagnosis not present

## 2022-02-21 DIAGNOSIS — Z79899 Other long term (current) drug therapy: Secondary | ICD-10-CM | POA: Diagnosis not present

## 2022-02-21 DIAGNOSIS — I480 Paroxysmal atrial fibrillation: Secondary | ICD-10-CM | POA: Diagnosis not present

## 2022-02-21 DIAGNOSIS — R7309 Other abnormal glucose: Secondary | ICD-10-CM | POA: Diagnosis not present

## 2022-02-21 DIAGNOSIS — R7301 Impaired fasting glucose: Secondary | ICD-10-CM | POA: Diagnosis not present

## 2022-02-21 DIAGNOSIS — E7800 Pure hypercholesterolemia, unspecified: Secondary | ICD-10-CM | POA: Diagnosis not present

## 2022-02-21 DIAGNOSIS — I4891 Unspecified atrial fibrillation: Secondary | ICD-10-CM | POA: Diagnosis not present

## 2022-04-22 DIAGNOSIS — L820 Inflamed seborrheic keratosis: Secondary | ICD-10-CM | POA: Diagnosis not present

## 2022-04-22 DIAGNOSIS — Z85828 Personal history of other malignant neoplasm of skin: Secondary | ICD-10-CM | POA: Diagnosis not present

## 2022-04-22 DIAGNOSIS — C44712 Basal cell carcinoma of skin of right lower limb, including hip: Secondary | ICD-10-CM | POA: Diagnosis not present

## 2022-04-22 DIAGNOSIS — L570 Actinic keratosis: Secondary | ICD-10-CM | POA: Diagnosis not present

## 2022-04-22 DIAGNOSIS — D225 Melanocytic nevi of trunk: Secondary | ICD-10-CM | POA: Diagnosis not present

## 2022-04-22 DIAGNOSIS — C44529 Squamous cell carcinoma of skin of other part of trunk: Secondary | ICD-10-CM | POA: Diagnosis not present

## 2022-04-22 DIAGNOSIS — D2261 Melanocytic nevi of right upper limb, including shoulder: Secondary | ICD-10-CM | POA: Diagnosis not present

## 2022-04-22 DIAGNOSIS — D485 Neoplasm of uncertain behavior of skin: Secondary | ICD-10-CM | POA: Diagnosis not present

## 2022-04-22 DIAGNOSIS — L430 Hypertrophic lichen planus: Secondary | ICD-10-CM | POA: Diagnosis not present

## 2022-05-23 ENCOUNTER — Other Ambulatory Visit: Payer: Self-pay | Admitting: *Deleted

## 2022-05-23 NOTE — Patient Outreach (Signed)
  Care Coordination   05/23/2022 Name: Douglas Fritz MRN: [id number] DOB: 1941/09/05   Care Coordination Outreach Attempts:  An unsuccessful telephone outreach was attempted today to offer the patient information about available care coordination services as a benefit of their health plan.   Follow Up Plan:  Additional outreach attempts will be made to offer the patient care coordination information and services.   Encounter Outcome:  No Answer  Care Coordination Interventions Activated:  No   Care Coordination Interventions:  No, not indicated    Raina Mina, RN Care Management Coordinator [place] Office [phone]

## 2022-06-17 ENCOUNTER — Other Ambulatory Visit: Payer: Self-pay | Admitting: Family Medicine

## 2022-06-17 ENCOUNTER — Ambulatory Visit
Admission: RE | Admit: 2022-06-17 | Discharge: 2022-06-17 | Disposition: A | Discharge status: Home / Self Care | Payer: PPO | Source: Ambulatory Visit | Attending: Family Medicine | Admitting: Family Medicine

## 2022-06-17 DIAGNOSIS — S80819A Abrasion, unspecified lower leg, initial encounter: Secondary | ICD-10-CM | POA: Diagnosis not present

## 2022-06-17 DIAGNOSIS — S8011XA Contusion of right lower leg, initial encounter: Secondary | ICD-10-CM

## 2022-06-17 DIAGNOSIS — R600 Localized edema: Secondary | ICD-10-CM | POA: Diagnosis not present

## 2022-06-18 ENCOUNTER — Telehealth: Payer: Self-pay

## 2022-06-18 NOTE — Patient Outreach (Signed)
  Care Coordination   06/18/2022 Name: Douglas Fritz MRN: [id number] DOB: 1941-08-28   Care Coordination Outreach Attempts:  A second unsuccessful outreach was attempted today to offer the patient with information about available care coordination services as a benefit of their health plan.     Follow Up Plan:  Additional outreach attempts will be made to offer the patient care coordination information and services.   Encounter Outcome:  No Answer  Care Coordination Interventions Activated:  No   Care Coordination Interventions:  No, not indicated    Peter Garter RN, BSN,CCM, [facility] [phone]

## 2022-06-26 ENCOUNTER — Telehealth: Payer: Self-pay

## 2022-06-26 NOTE — Patient Outreach (Signed)
  Care Coordination   06/26/2022 Name: Douglas Fritz MRN: [id number] DOB: [birth year]   Care Coordination Outreach Attempts:  A third unsuccessful outreach was attempted today to offer the patient with information about available care coordination services as a benefit of their health plan.   Follow Up Plan:  No further outreach attempts will be made at this time. We have been unable to contact the patient to offer or enroll patient in care coordination services  Encounter Outcome:  No Answer  Care Coordination Interventions Activated:  No   Care Coordination Interventions:  No, not indicated    Peter Garter RN, BSN,CCM, [facility] [phone]

## 2022-08-04 ENCOUNTER — Other Ambulatory Visit: Payer: Self-pay | Admitting: Cardiology

## 2022-08-04 DIAGNOSIS — I341 Nonrheumatic mitral (valve) prolapse: Secondary | ICD-10-CM

## 2022-08-07 ENCOUNTER — Encounter: Payer: Self-pay | Admitting: Cardiology

## 2022-08-07 ENCOUNTER — Ambulatory Visit: Payer: PPO | Admitting: Cardiology

## 2022-08-07 VITALS — BP 139/74 | HR 70 | Resp 16 | Ht 68.0 in | Wt 168.0 lb

## 2022-08-07 DIAGNOSIS — I10 Essential (primary) hypertension: Secondary | ICD-10-CM

## 2022-08-07 DIAGNOSIS — I071 Rheumatic tricuspid insufficiency: Secondary | ICD-10-CM

## 2022-08-07 DIAGNOSIS — I340 Nonrheumatic mitral (valve) insufficiency: Secondary | ICD-10-CM

## 2022-08-07 DIAGNOSIS — I4821 Permanent atrial fibrillation: Secondary | ICD-10-CM

## 2022-08-07 NOTE — Progress Notes (Signed)
Primary Physician/Referring:  Lujean Amel, MD  Patient ID: Douglas Fritz, male    DOB: [birth year], 81 y.o.   MRN: [id number]  Chief Complaint  Patient presents with   Atrial Fibrillation   Moderate to severe mitral regurgitation   Follow-up    1 year    HPI:    Douglas Fritz  is a 81 y.o. Caucasian male with history of hypertension, prostate cancer, Barrett's esophagus with new onset atrial fibrillation that was noted during colonoscopy/endoscopy on 04/05/18. He is followed in our clinic for mitral regurgitation and tricuspid regurgitation as well.   The patient presents for 82-monthfollow up visit. He is overall doing very well. He monitors his blood pressure at home and it has been well controlled.  He denies any chest pain, dyspnea, leg swelling, palpitations, orthopnea. Denies abnormal bleeding. He is tolerating his medications well without side effects.   Past Medical History:  Diagnosis Date   Anxiety    Barrett's esophagus without dysplasia dx 2016   Colon polyps    Depression    Dysphagia 03/03/2015   ED (erectile dysfunction)    H. pylori infection    Hypertension    [place]    [place]    [place]    Pulmonary nodules    Skin cancer    melanoma   Past Surgical History:  Procedure Laterality Date   CARDIOVERSION N/A 06/30/2018   Procedure: CARDIOVERSION;  Surgeon: GAdrian Prows MD;  [place] [facility]  Service: Cardiovascular;  Laterality: N/A;   [place]    MELANOMA EXCISION  11/2012   TEE WITHOUT CARDIOVERSION N/A 05/22/2018   Procedure: TRANSESOPHAGEAL ECHOCARDIOGRAM (TEE);  Surgeon: GAdrian Prows MD;  [facility]  Service: Cardiovascular;  Laterality: N/A;    Social History   Tobacco Use   Smoking status: Never   Smokeless tobacco: Never  Substance Use Topics   Alcohol use: Yes    Alcohol/week: 7.0 standard drinks of alcohol    Types: 7 Cans of beer per  week    Comment: SOCIAL   Marital Status: Married  ROS  Review of Systems  Cardiovascular:  Negative for chest pain, dyspnea on exertion and leg swelling.   Objective      08/07/2022    9:08 AM 08/06/2021    9:11 AM 12/22/2020    8:36 AM  Vitals with BMI  Height '5\' 8"'$  '5\' 8"'$  '5\' 8"'$   Weight 168 lbs 167 lbs 10 oz 160 lbs 6 oz  BMI 25.55 [phone] Systolic [id number] Diastolic 74 76 80  Pulse 70 75 72    Physical Exam Constitutional:      Appearance: He is well-developed.  Neck:     Vascular: No carotid bruit or JVD.  Cardiovascular:     Rate and Rhythm: Normal rate. Rhythm irregularly irregular.     Pulses: Normal pulses and intact distal pulses.     Heart sounds: A midsystolic click. Murmur heard.     High-pitched blowing holosystolic murmur is present with a grade of 3/6 at the apex.       Gallop present. No S3 sounds.     Comments:   Pulmonary:     Effort: Pulmonary effort is normal.     Breath sounds: Normal breath sounds.  Abdominal:     General: Bowel sounds are normal.     Palpations: Abdomen is soft.    Radiology:  No results found.  Laboratory examination:   External Labs:  Cholesterol, total 117.000 m 02/21/2022 HDL 40.000 mg 02/21/2022 LDL 65.000 mg 02/21/2022 Triglycerides 52.000 mg 02/21/2022  A1C 6.300 % 02/21/2022 TSH 3.610 02/21/2022  Hemoglobin 13.900 g/d 02/21/2022  Creatinine, Serum 1.040 mg/ 02/21/2022 Potassium 3.600 mm 02/21/2022 ALT (SGPT) 18.000 U/L 02/21/2022    Medications   Allergies  Allergen Reactions   Bee Venom Hives and Swelling   Current Outpatient Medications:    amLODipine (NORVASC) 10 MG tablet, Take 1 tablet (10 mg total) by mouth daily., Disp: 90 tablet, Rfl: 2   atorvastatin (LIPITOR) 10 MG tablet, Take 1 tablet (10 mg total) by mouth daily., Disp: 90 tablet, Rfl: 2   ELIQUIS 5 MG TABS tablet, TAKE ONE TABLET BY MOUTH TWICE A DAY, Disp: 180 tablet, Rfl: 2   sertraline (ZOLOFT) 100 MG tablet, Take 100 mg by mouth daily.,  Disp: , Rfl:    valsartan-hydrochlorothiazide (DIOVAN-HCT) 160-12.5 MG tablet, Take 1 tablet by mouth every morning., Disp: 90 tablet, Rfl: 2   Cardiac Studies:   Direct current cardioversion 06/30/2018: 100J A. Fib to S. Bradycardia. Occasional Mobitz II AV Block.  Exercise myoview stress 06/08/2018: 1. The patient performed treadmill exercise using Bruce protocol, completing 5:30 minutes. The patient completed an estimated workload of 7 METS, reaching 105% of the maximum predicted heart rate. Resting hypertension 160/98 mmHg with normal hemodynamic response. No stress symptoms reported. Low exercise capacity. No ischemic changes seen on stress electrocardiogram.  2. The overall quality of the study is good. There is no evidence of abnormal lung activity.  Decreased uptake seen in entire myocardium on short axis stress images likely due to tissue attenuation. Otherwise, stress and rest SPECT images demonstrate homogeneous tracer distribution throughout the myocardium. Gated SPECT imaging reveals normal myocardial thickening and wall motion. The left ventricular ejection fraction was normal (66%).   3. Low risk study.  PCV ECHOCARDIOGRAM COMPLETE 02/05/2022  Narrative Echocardiogram 02/05/2022: Left ventricle cavity is normal in size and wall thickness. Interventricular septal flattening due to RV pressure overload Normal LV systolic function with EF 54%. Indeterminate diastolic filling pattern. Calculated EF 54%. Left atrial cavity is severely dilated. Right atrial cavity is severely dilated. Right ventricle cavity is mildly dilated. Normal right ventricular function. Moderate (Grade III) mitral regurgitation. Severe tricuspid regurgitation. Estimated pulmonary artery systolic pressure 43 mmHg. IVC is dilated with a respiratory response of >50%. Estimated right atrial pressure 15 mmHg, which is new compared to previous study on 06/26/2021. No other significant change noted.   EKG   EKG  08/07/2022: Atrial fibrillation with controlled ventricular response at the rate of 62 bpm, leftward axis, incomplete right bundle branch block.  Nonspecific T abnormality.  Compared to 08/06/2021, no significant change.  Assessment     ICD-10-CM   1. Permanent atrial fibrillation (HCC)  I48.21 EKG 12-Lead    2. Moderate to severe mitral regurgitation  I34.0 PCV ECHOCARDIOGRAM COMPLETE    3. Moderate tricuspid regurgitation  I07.1 PCV ECHOCARDIOGRAM COMPLETE    4. Essential hypertension  I10       CHA2DS2-VASc Score is 3.  Yearly risk of stroke: 3.2% (A, HTN).  Score of 1=0.6; 2=2.2; 3=3.2; 4=4.8; 5=7.2; 6=9.8; 7=>9.8) -(CHF; HTN; vasc disease DM,  Male = 1; Age <65 =0; 65-74 = 1,  >75 =2; stroke/embolism= 2).   No orders of the defined types were placed in this encounter.  There are no discontinued medications.  Recommendations:   Douglas Fritz is a 81  y.o. Caucasian male with history of hypertension, prostate cancer, Barrett's esophagus with new onset atrial fibrillation that was noted during colonoscopy/endoscopy on 04/05/18. He is followed in our clinic for mitral regurgitation and tricuspid regurgitation as well.  The patient presents for 45-monthfollow up visit. He is doing well overall, remains asymptomatic and is tolerating anticoagulation without bleeding diathesis.  1. Permanent atrial fibrillation ([facility] Patient permanent atrial fibrillation, rate remains well controlled.  He is tolerating anticoagulation without any bleeding diathesis.  Patient is interested in OCEANIC-AF (Asundexian - factor XIa inhibitor PO BID vs Apixaban PO BID in patients with A. Fib for stroke prevention.   2. Moderate to severe mitral regurgitation I reviewed the echocardiogram, 6 months ago he has moderate to moderately severe mitral regurgitation with severe tricuspid regurgitation.  There is also RV dilatation with evidence of RV strain on the LV.  I would like to repeat echocardiogram, if there has  been progression of RV dilatation, she should prophylactically proceed with surgery.  3. Moderate tricuspid regurgitation As dictated above, no clinical evidence of heart failure.  No leg edema, no hepatomegaly.  Continue observation for now.  4. Essential hypertension Blood pressure is well controlled on present medical regimen, no changes were done today.  I reviewed external labs, lipids under excellent control, renal function has remained stable as well.   JAdrian Prows MD, [facility]11/15/2023, 9:33 AM Office: [phone]

## 2022-08-14 ENCOUNTER — Ambulatory Visit: Payer: PPO

## 2022-08-14 DIAGNOSIS — I071 Rheumatic tricuspid insufficiency: Secondary | ICD-10-CM

## 2022-08-14 DIAGNOSIS — I340 Nonrheumatic mitral (valve) insufficiency: Secondary | ICD-10-CM

## 2022-08-18 NOTE — Progress Notes (Signed)
GM. Hope you had a good long weekend. I have ordered echo in 6 months, change OV to after echo please.

## 2022-08-22 DIAGNOSIS — L821 Other seborrheic keratosis: Secondary | ICD-10-CM | POA: Diagnosis not present

## 2022-08-22 DIAGNOSIS — L820 Inflamed seborrheic keratosis: Secondary | ICD-10-CM | POA: Diagnosis not present

## 2022-08-22 DIAGNOSIS — D2262 Melanocytic nevi of left upper limb, including shoulder: Secondary | ICD-10-CM | POA: Diagnosis not present

## 2022-08-22 DIAGNOSIS — D225 Melanocytic nevi of trunk: Secondary | ICD-10-CM | POA: Diagnosis not present

## 2022-08-22 DIAGNOSIS — L853 Xerosis cutis: Secondary | ICD-10-CM | POA: Diagnosis not present

## 2022-08-22 DIAGNOSIS — L812 Freckles: Secondary | ICD-10-CM | POA: Diagnosis not present

## 2022-08-22 DIAGNOSIS — Z85828 Personal history of other malignant neoplasm of skin: Secondary | ICD-10-CM | POA: Diagnosis not present

## 2022-08-22 DIAGNOSIS — L578 Other skin changes due to chronic exposure to nonionizing radiation: Secondary | ICD-10-CM | POA: Diagnosis not present

## 2022-08-22 DIAGNOSIS — D2271 Melanocytic nevi of right lower limb, including hip: Secondary | ICD-10-CM | POA: Diagnosis not present

## 2022-08-28 DIAGNOSIS — R7303 Prediabetes: Secondary | ICD-10-CM | POA: Diagnosis not present

## 2022-09-12 ENCOUNTER — Other Ambulatory Visit: Payer: Self-pay | Admitting: Cardiology

## 2022-11-21 DIAGNOSIS — M25552 Pain in left hip: Secondary | ICD-10-CM | POA: Diagnosis not present

## 2022-12-09 DIAGNOSIS — M545 Low back pain, unspecified: Secondary | ICD-10-CM | POA: Diagnosis not present

## 2022-12-19 DIAGNOSIS — D225 Melanocytic nevi of trunk: Secondary | ICD-10-CM | POA: Diagnosis not present

## 2022-12-19 DIAGNOSIS — L821 Other seborrheic keratosis: Secondary | ICD-10-CM | POA: Diagnosis not present

## 2022-12-19 DIAGNOSIS — Z85828 Personal history of other malignant neoplasm of skin: Secondary | ICD-10-CM | POA: Diagnosis not present

## 2022-12-19 DIAGNOSIS — D2262 Melanocytic nevi of left upper limb, including shoulder: Secondary | ICD-10-CM | POA: Diagnosis not present

## 2022-12-19 DIAGNOSIS — D2272 Melanocytic nevi of left lower limb, including hip: Secondary | ICD-10-CM | POA: Diagnosis not present

## 2022-12-19 DIAGNOSIS — D2271 Melanocytic nevi of right lower limb, including hip: Secondary | ICD-10-CM | POA: Diagnosis not present

## 2022-12-19 DIAGNOSIS — L300 Nummular dermatitis: Secondary | ICD-10-CM | POA: Diagnosis not present

## 2022-12-19 DIAGNOSIS — D485 Neoplasm of uncertain behavior of skin: Secondary | ICD-10-CM | POA: Diagnosis not present

## 2022-12-19 DIAGNOSIS — L570 Actinic keratosis: Secondary | ICD-10-CM | POA: Diagnosis not present

## 2022-12-19 DIAGNOSIS — C44319 Basal cell carcinoma of skin of other parts of face: Secondary | ICD-10-CM | POA: Diagnosis not present

## 2022-12-20 DIAGNOSIS — M5416 Radiculopathy, lumbar region: Secondary | ICD-10-CM | POA: Diagnosis not present

## 2022-12-31 DIAGNOSIS — D6869 Other thrombophilia: Secondary | ICD-10-CM | POA: Diagnosis not present

## 2022-12-31 DIAGNOSIS — M5416 Radiculopathy, lumbar region: Secondary | ICD-10-CM | POA: Diagnosis not present

## 2022-12-31 DIAGNOSIS — I4891 Unspecified atrial fibrillation: Secondary | ICD-10-CM | POA: Diagnosis not present

## 2023-01-09 DIAGNOSIS — M4317 Spondylolisthesis, lumbosacral region: Secondary | ICD-10-CM | POA: Diagnosis not present

## 2023-01-09 DIAGNOSIS — M5416 Radiculopathy, lumbar region: Secondary | ICD-10-CM | POA: Diagnosis not present

## 2023-01-09 DIAGNOSIS — M5126 Other intervertebral disc displacement, lumbar region: Secondary | ICD-10-CM | POA: Diagnosis not present

## 2023-01-16 ENCOUNTER — Other Ambulatory Visit: Payer: PPO

## 2023-01-20 DIAGNOSIS — Z85828 Personal history of other malignant neoplasm of skin: Secondary | ICD-10-CM | POA: Diagnosis not present

## 2023-01-20 DIAGNOSIS — C44319 Basal cell carcinoma of skin of other parts of face: Secondary | ICD-10-CM | POA: Diagnosis not present

## 2023-01-21 ENCOUNTER — Other Ambulatory Visit: Payer: Self-pay | Admitting: Cardiology

## 2023-01-21 DIAGNOSIS — I10 Essential (primary) hypertension: Secondary | ICD-10-CM

## 2023-02-05 ENCOUNTER — Ambulatory Visit: Payer: PPO | Admitting: Cardiology

## 2023-02-12 ENCOUNTER — Ambulatory Visit: Payer: PPO

## 2023-02-12 DIAGNOSIS — I340 Nonrheumatic mitral (valve) insufficiency: Secondary | ICD-10-CM | POA: Diagnosis not present

## 2023-02-12 DIAGNOSIS — I071 Rheumatic tricuspid insufficiency: Secondary | ICD-10-CM

## 2023-02-12 NOTE — Progress Notes (Signed)
Echocardiogram 02/12/2023: Normal LV systolic function with visual EF 55-60%. Left ventricle cavity is normal in size. Normal left ventricular wall thickness. Normal global wall motion. Indeterminate diastolic filling pattern. Calculated EF 54%. Left atrial cavity is severely dilated at 95.1 ml/m^2. Right atrial cavity is severely dilated. Right ventricle cavity is moderately dilated. Normal right ventricular function. Structurally normal mitral valve. Mild (Grade I) mitral regurgitation. Structurally normal tricuspid valve. Moderate to severe tricuspid regurgitation. Mild to moderate pulmonary hypertension. RVSP measures 42 mmHg. Structurally normal pulmonic valve. Mild pulmonic regurgitation. IVC is dilated with respiratory variation. No significant change compared to 07/2022.

## 2023-02-18 ENCOUNTER — Encounter: Payer: Self-pay | Admitting: Cardiology

## 2023-02-18 ENCOUNTER — Ambulatory Visit: Payer: PPO | Admitting: Cardiology

## 2023-02-18 VITALS — BP 121/73 | HR 70 | Ht 68.0 in | Wt 163.4 lb

## 2023-02-18 DIAGNOSIS — I4821 Permanent atrial fibrillation: Secondary | ICD-10-CM

## 2023-02-18 DIAGNOSIS — I10 Essential (primary) hypertension: Secondary | ICD-10-CM

## 2023-02-18 DIAGNOSIS — I341 Nonrheumatic mitral (valve) prolapse: Secondary | ICD-10-CM | POA: Diagnosis not present

## 2023-02-18 DIAGNOSIS — I071 Rheumatic tricuspid insufficiency: Secondary | ICD-10-CM

## 2023-02-18 NOTE — Progress Notes (Signed)
Primary Physician/Referring:  Douglas Bussing, MD  Patient ID: Douglas Fritz, male    DOB: 07-04-1941, 82 y.o.   MRN: [id number]  Chief Complaint  Patient presents with   Permanent atrial fibrillation (HCC)   Moderate tricuspid regurgitation   Essential hypertension   Mitral Valve Prolapse   Follow-up    HPI:    Douglas Fritz  is a 82 y.o. Caucasian male with history of hypertension, prostate cancer, Barrett's esophagus with new onset atrial fibrillation that was noted during colonoscopy/endoscopy on 04/05/18. He is followed in our clinic for mitral regurgitation and tricuspid regurgitation as well.   The patient presents for 68-month follow up visit. He is overall doing very well. He monitors his blood pressure at home and it has been well controlled.  He denies any chest pain, dyspnea, leg swelling, palpitations, orthopnea. Denies abnormal bleeding. He is tolerating his medications well without side effects.   Past Medical History:  Diagnosis Date   Anxiety    Barrett's esophagus without dysplasia dx 2016   Colon polyps    Depression    Dysphagia 03/03/2015   ED (erectile dysfunction)    H. pylori infection    Hypertension    Melanoma (HCC)    Prostate cancer (HCC)    Prostate cancer (HCC)    Pulmonary nodules    Skin cancer    melanoma   Past Surgical History:  Procedure Laterality Date   CARDIOVERSION N/A 06/30/2018   Procedure: CARDIOVERSION;  Surgeon: Yates Decamp, MD;  Location: [facility];  Service: Cardiovascular;  Laterality: N/A;   INSERTION PROSTATE RADIATION SEED  1997   KNEE SURGERY Right    MELANOMA EXCISION  11/2012   TEE WITHOUT CARDIOVERSION N/A 05/22/2018   Procedure: TRANSESOPHAGEAL ECHOCARDIOGRAM (TEE);  Surgeon: Yates Decamp, MD;  [facility];  Service: Cardiovascular;  Laterality: N/A;    Social History   Tobacco Use   Smoking status: Never   Smokeless tobacco: Never  Substance Use Topics   Alcohol use: Yes    Alcohol/week: 7.0 standard  drinks of alcohol    Types: 7 Cans of beer per week    Comment: SOCIAL   Marital Status: Married  ROS  Review of Systems  Cardiovascular:  Negative for chest pain, dyspnea on exertion and leg swelling.   Objective      02/18/2023   11:26 AM 08/07/2022    9:08 AM 08/06/2021    9:11 AM  Vitals with BMI  Height 5\' 8"  5\' 8"  5\' 8"   Weight 163 lbs 6 oz 168 lbs 167 lbs 10 oz  BMI 24.85 25.55 25.49  Systolic 121 139 409  Diastolic 73 74 76  Pulse 70 70 75    Physical Exam Constitutional:      Appearance: He is well-developed.  Neck:     Vascular: No carotid bruit or JVD.  Cardiovascular:     Rate and Rhythm: Normal rate. Rhythm irregularly irregular.     Pulses: Normal pulses and intact distal pulses.     Heart sounds: A midsystolic click. Murmur heard.     Holosystolic murmur is present with a grade of 3/6 at the apex.       No gallop. No S3 sounds.     Comments:   Pulmonary:     Effort: Pulmonary effort is normal.     Breath sounds: Normal breath sounds.  Abdominal:     General: Bowel sounds are normal.     Palpations: Abdomen is soft.  Radiology: No results found.  Laboratory examination:   External Labs:  Cholesterol, total 117.000 m 02/21/2022 HDL 40.000 mg 02/21/2022 LDL 65.000 mg 02/21/2022 Triglycerides 52.000 mg 02/21/2022  A1C 6.300 % 02/21/2022 TSH 3.610 02/21/2022  Hemoglobin 13.900 g/d 02/21/2022  Creatinine, Serum 1.040 mg/ 02/21/2022 Potassium 3.600 mm 02/21/2022 ALT (SGPT) 18.000 U/L 02/21/2022    Medications   Allergies  Allergen Reactions   Bee Venom Hives and Swelling   Current Outpatient Medications:    amLODipine (NORVASC) 10 MG tablet, TAKE ONE TABLET BY MOUTH DAILY, Disp: 30 tablet, Rfl: 0   atorvastatin (LIPITOR) 10 MG tablet, TAKE ONE TABLET BY MOUTH DAILY, Disp: 90 tablet, Rfl: 2   ELIQUIS 5 MG TABS tablet, TAKE ONE TABLET BY MOUTH TWICE A DAY, Disp: 180 tablet, Rfl: 2   sertraline (ZOLOFT) 100 MG tablet, Take 100 mg by mouth daily., Disp:  , Rfl:    valsartan-hydrochlorothiazide (DIOVAN-HCT) 160-12.5 MG tablet, TAKE ONE TABLET BY MOUTH EVERY MORNING, Disp: 90 tablet, Rfl: 2   Cardiac Studies:   Direct current cardioversion 06/30/2018: 100J A. Fib to S. Bradycardia. Occasional Mobitz II AV Block.  Exercise myoview stress 06/08/2018: 1. The patient performed treadmill exercise using Bruce protocol, completing 5:30 minutes. The patient completed an estimated workload of 7 METS, reaching 105% of the maximum predicted heart rate. Resting hypertension 160/98 mmHg with normal hemodynamic response. No stress symptoms reported. Low exercise capacity. No ischemic changes seen on stress electrocardiogram.  2. The overall quality of the study is good. There is no evidence of abnormal lung activity.  Decreased uptake seen in entire myocardium on short axis stress images likely due to tissue attenuation. Otherwise, stress and rest SPECT images demonstrate homogeneous tracer distribution throughout the myocardium. Gated SPECT imaging reveals normal myocardial thickening and wall motion. The left ventricular ejection fraction was normal (66%).   3. Low risk study.  Echocardiogram 02/12/2023: Normal LV systolic function with visual EF 55-60%. Left ventricle cavity is normal in size. Normal left ventricular wall thickness. Normal global wall motion. Indeterminate diastolic filling pattern. Calculated EF 54%. Left atrial cavity is severely dilated at 95.1 ml/m^2. Right atrial cavity is severely dilated. Right ventricle cavity is moderately dilated. Normal right ventricular function. Structurally normal mitral valve. Mild (Grade I) mitral regurgitation. Structurally normal tricuspid valve. Moderate to severe tricuspid regurgitation. Mild to moderate pulmonary hypertension. RVSP measures 42 mmHg. Structurally normal pulmonic valve. Mild pulmonic regurgitation. IVC is dilated with respiratory variation. No significant change compared to 07/2022.  EKG    EKG 02/18/2023: Atrial fibrillation with controlled ventricular sponsor at rate of 67 bpm, normal axis, incomplete right bundle branch block.  Nonspecific T abnormality.  PVC (2).  Compared to 08/07/2022, no significant change.   Assessment     ICD-10-CM   1. Permanent atrial fibrillation (HCC)  I48.21 EKG 12-Lead    2. Moderate tricuspid regurgitation  I07.1 PCV ECHOCARDIOGRAM COMPLETE    3. Essential hypertension  I10     4. MVP (mitral valve prolapse)  I34.1 PCV ECHOCARDIOGRAM COMPLETE      CHA2DS2-VASc Score is 3.  Yearly risk of stroke: 3.2% (A, HTN).  Score of 1=0.6; 2=2.2; 3=3.2; 4=4.8; 5=7.2; 6=9.8; 7=>9.8) -(CHF; HTN; vasc disease DM,  Male = 1; Age <65 =0; 65-74 = 1,  >75 =2; stroke/embolism= 2).   No orders of the defined types were placed in this encounter.  There are no discontinued medications.  Recommendations:   Ibhan Markunas is a 82 y.o. Caucasian male with history of  hypertension, prostate cancer, Barrett's esophagus with new onset atrial fibrillation that was noted during colonoscopy/endoscopy on 04/05/18. He is followed in our clinic for mitral regurgitation and tricuspid regurgitation as well.  The patient presents for 22-month follow up visit. He is doing well overall, remains asymptomatic and is tolerating anticoagulation without bleeding diathesis.  1. Permanent atrial fibrillation [facility]) Patient permanent atrial fibrillation, rate remains well controlled.  He is tolerating anticoagulation without any bleeding diathesis.  He is scheduled for complete physical examination and lab work next month. - EKG 12-Lead  2. Moderate tricuspid regurgitation Upon review of the echocardiogram personally, tricuspid regurgitation appears to be severe.  Fortunately right ventricular systolic function is still well-preserved although it is mildly dilated.  Will continue to monitor his with repeat echocardiogram in a year as he has remained relatively stable over time.  Unless he  develops symptoms of dyspnea or decreased exercise tolerance or heart failure then will certainly repeat echocardiogram sooner than a year. - PCV ECHOCARDIOGRAM COMPLETE; Future  3. Essential hypertension Blood pressure under excellent control with a combination of valsartan HCT and amlodipine, continue the same.  4. MVP (mitral valve prolapse) As dictated above, the patient has moderately severe MR, recent echocardiogram only reveals mild MR.  Will continue to monitor this clinically.  Office visit in a year. - PCV ECHOCARDIOGRAM COMPLETE; Future    Yates Decamp, MD, [facility] 02/18/2023, 12:15 PM Office: [phone]

## 2023-03-07 ENCOUNTER — Other Ambulatory Visit: Payer: Self-pay | Admitting: Cardiology

## 2023-03-07 DIAGNOSIS — I10 Essential (primary) hypertension: Secondary | ICD-10-CM

## 2023-03-10 DIAGNOSIS — R7303 Prediabetes: Secondary | ICD-10-CM | POA: Diagnosis not present

## 2023-03-10 DIAGNOSIS — Z79899 Other long term (current) drug therapy: Secondary | ICD-10-CM | POA: Diagnosis not present

## 2023-03-10 DIAGNOSIS — I10 Essential (primary) hypertension: Secondary | ICD-10-CM | POA: Diagnosis not present

## 2023-03-10 DIAGNOSIS — I4821 Permanent atrial fibrillation: Secondary | ICD-10-CM | POA: Diagnosis not present

## 2023-03-10 DIAGNOSIS — I071 Rheumatic tricuspid insufficiency: Secondary | ICD-10-CM | POA: Diagnosis not present

## 2023-03-10 DIAGNOSIS — Z0001 Encounter for general adult medical examination with abnormal findings: Secondary | ICD-10-CM | POA: Diagnosis not present

## 2023-03-10 DIAGNOSIS — R7301 Impaired fasting glucose: Secondary | ICD-10-CM | POA: Diagnosis not present

## 2023-03-10 DIAGNOSIS — D6869 Other thrombophilia: Secondary | ICD-10-CM | POA: Diagnosis not present

## 2023-03-10 DIAGNOSIS — I272 Pulmonary hypertension, unspecified: Secondary | ICD-10-CM | POA: Diagnosis not present

## 2023-03-10 DIAGNOSIS — E7800 Pure hypercholesterolemia, unspecified: Secondary | ICD-10-CM | POA: Diagnosis not present

## 2023-04-21 DIAGNOSIS — D2272 Melanocytic nevi of left lower limb, including hip: Secondary | ICD-10-CM | POA: Diagnosis not present

## 2023-04-21 DIAGNOSIS — D225 Melanocytic nevi of trunk: Secondary | ICD-10-CM | POA: Diagnosis not present

## 2023-04-21 DIAGNOSIS — L570 Actinic keratosis: Secondary | ICD-10-CM | POA: Diagnosis not present

## 2023-04-21 DIAGNOSIS — Z85828 Personal history of other malignant neoplasm of skin: Secondary | ICD-10-CM | POA: Diagnosis not present

## 2023-04-21 DIAGNOSIS — D1801 Hemangioma of skin and subcutaneous tissue: Secondary | ICD-10-CM | POA: Diagnosis not present

## 2023-04-21 DIAGNOSIS — L812 Freckles: Secondary | ICD-10-CM | POA: Diagnosis not present

## 2023-04-21 DIAGNOSIS — L821 Other seborrheic keratosis: Secondary | ICD-10-CM | POA: Diagnosis not present

## 2023-04-21 DIAGNOSIS — L300 Nummular dermatitis: Secondary | ICD-10-CM | POA: Diagnosis not present

## 2023-04-21 DIAGNOSIS — D2271 Melanocytic nevi of right lower limb, including hip: Secondary | ICD-10-CM | POA: Diagnosis not present

## 2023-05-11 DIAGNOSIS — M5416 Radiculopathy, lumbar region: Secondary | ICD-10-CM | POA: Diagnosis not present

## 2023-05-14 ENCOUNTER — Other Ambulatory Visit: Payer: Self-pay

## 2023-05-14 DIAGNOSIS — I341 Nonrheumatic mitral (valve) prolapse: Secondary | ICD-10-CM

## 2023-05-14 MED ORDER — ELIQUIS 5 MG PO TABS
5.0000 mg | ORAL_TABLET | Freq: Two times a day (BID) | ORAL | 2 refills | Status: DC
Start: 2023-05-14 — End: 2024-02-05

## 2023-05-15 DIAGNOSIS — M5416 Radiculopathy, lumbar region: Secondary | ICD-10-CM | POA: Diagnosis not present

## 2023-06-12 ENCOUNTER — Other Ambulatory Visit: Payer: Self-pay | Admitting: Cardiology

## 2023-06-24 ENCOUNTER — Other Ambulatory Visit: Payer: Self-pay

## 2023-06-24 MED ORDER — VALSARTAN-HYDROCHLOROTHIAZIDE 160-12.5 MG PO TABS: 1.0000 | ORAL_TABLET | Freq: Every morning | ORAL | 2 refills | Status: DC

## 2023-07-01 DIAGNOSIS — H25813 Combined forms of age-related cataract, bilateral: Secondary | ICD-10-CM | POA: Diagnosis not present

## 2023-07-17 DIAGNOSIS — H25811 Combined forms of age-related cataract, right eye: Secondary | ICD-10-CM | POA: Diagnosis not present

## 2023-07-31 DIAGNOSIS — H25812 Combined forms of age-related cataract, left eye: Secondary | ICD-10-CM | POA: Diagnosis not present

## 2023-08-25 DIAGNOSIS — D692 Other nonthrombocytopenic purpura: Secondary | ICD-10-CM | POA: Diagnosis not present

## 2023-08-25 DIAGNOSIS — D2261 Melanocytic nevi of right upper limb, including shoulder: Secondary | ICD-10-CM | POA: Diagnosis not present

## 2023-08-25 DIAGNOSIS — Z85820 Personal history of malignant melanoma of skin: Secondary | ICD-10-CM | POA: Diagnosis not present

## 2023-08-25 DIAGNOSIS — L111 Transient acantholytic dermatosis [Grover]: Secondary | ICD-10-CM | POA: Diagnosis not present

## 2023-08-25 DIAGNOSIS — D225 Melanocytic nevi of trunk: Secondary | ICD-10-CM | POA: Diagnosis not present

## 2023-08-25 DIAGNOSIS — Z85828 Personal history of other malignant neoplasm of skin: Secondary | ICD-10-CM | POA: Diagnosis not present

## 2023-08-25 DIAGNOSIS — L821 Other seborrheic keratosis: Secondary | ICD-10-CM | POA: Diagnosis not present

## 2023-08-25 DIAGNOSIS — L308 Other specified dermatitis: Secondary | ICD-10-CM | POA: Diagnosis not present

## 2023-12-24 DIAGNOSIS — Z85828 Personal history of other malignant neoplasm of skin: Secondary | ICD-10-CM | POA: Diagnosis not present

## 2023-12-24 DIAGNOSIS — D225 Melanocytic nevi of trunk: Secondary | ICD-10-CM | POA: Diagnosis not present

## 2023-12-24 DIAGNOSIS — L821 Other seborrheic keratosis: Secondary | ICD-10-CM | POA: Diagnosis not present

## 2023-12-24 DIAGNOSIS — D2271 Melanocytic nevi of right lower limb, including hip: Secondary | ICD-10-CM | POA: Diagnosis not present

## 2023-12-24 DIAGNOSIS — Z85820 Personal history of malignant melanoma of skin: Secondary | ICD-10-CM | POA: Diagnosis not present

## 2023-12-24 DIAGNOSIS — L570 Actinic keratosis: Secondary | ICD-10-CM | POA: Diagnosis not present

## 2023-12-24 DIAGNOSIS — D2272 Melanocytic nevi of left lower limb, including hip: Secondary | ICD-10-CM | POA: Diagnosis not present

## 2023-12-24 DIAGNOSIS — L853 Xerosis cutis: Secondary | ICD-10-CM | POA: Diagnosis not present

## 2024-02-05 ENCOUNTER — Other Ambulatory Visit: Payer: Self-pay | Admitting: Cardiology

## 2024-02-05 DIAGNOSIS — I341 Nonrheumatic mitral (valve) prolapse: Secondary | ICD-10-CM

## 2024-02-05 NOTE — Telephone Encounter (Signed)
 Prescription refill request for Eliquis  received. Indication:  Last office visit: 02/18/23 [place])  Scr:  Age: 83 Weight: 74.1kg  Labs overdue. Will call PCP to determine if labs have been drawn within the past week.

## 2024-02-05 NOTE — Telephone Encounter (Signed)
 Prescription refill request for Eliquis  received. Indication: afib  Last office visit: [place], 02/18/2023 Scr: 0.88, 03/12/2023 Age: 83 yo  Weight: 74.1 kg   Refill sent.

## 2024-02-18 ENCOUNTER — Ambulatory Visit: Payer: Self-pay | Admitting: Cardiology

## 2024-02-18 ENCOUNTER — Ambulatory Visit (HOSPITAL_COMMUNITY)
Admission: RE | Admit: 2024-02-18 | Discharge: 2024-02-18 | Disposition: A | Discharge status: Home / Self Care | Payer: PPO | Source: Ambulatory Visit | Attending: Cardiology | Admitting: Cardiology

## 2024-02-18 ENCOUNTER — Other Ambulatory Visit: Payer: PPO

## 2024-02-18 DIAGNOSIS — I341 Nonrheumatic mitral (valve) prolapse: Secondary | ICD-10-CM

## 2024-02-18 DIAGNOSIS — I071 Rheumatic tricuspid insufficiency: Secondary | ICD-10-CM | POA: Diagnosis not present

## 2024-02-18 LAB — ECHOCARDIOGRAM COMPLETE
Area-P 1/2: 4.21 cm2
S' Lateral: 3.3 cm

## 2024-02-18 NOTE — Progress Notes (Signed)
 He needs F/U appointment.  He has normal LV systolic function, right ventricle is dilated and there is moderate elevation in RV pressure that is unchanged from prior echocardiogram on 02/12/2023 however the tricuspid regurgitation is now severe compared to moderate previously.  He may require surgery but will discuss with the patient when he comes in.

## 2024-02-25 ENCOUNTER — Ambulatory Visit: Payer: PPO | Admitting: Cardiology

## 2024-03-09 ENCOUNTER — Other Ambulatory Visit: Payer: Self-pay | Admitting: Cardiology

## 2024-03-09 DIAGNOSIS — I10 Essential (primary) hypertension: Secondary | ICD-10-CM

## 2024-03-10 ENCOUNTER — Ambulatory Visit: Attending: Cardiology | Admitting: Cardiology

## 2024-03-10 ENCOUNTER — Other Ambulatory Visit: Payer: Self-pay

## 2024-03-10 ENCOUNTER — Encounter: Payer: Self-pay | Admitting: Cardiology

## 2024-03-10 VITALS — BP 155/81 | HR 74 | Resp 16 | Ht 68.0 in | Wt 157.0 lb

## 2024-03-10 DIAGNOSIS — I4821 Permanent atrial fibrillation: Secondary | ICD-10-CM | POA: Diagnosis not present

## 2024-03-10 DIAGNOSIS — I341 Nonrheumatic mitral (valve) prolapse: Secondary | ICD-10-CM

## 2024-03-10 DIAGNOSIS — I10 Essential (primary) hypertension: Secondary | ICD-10-CM | POA: Diagnosis not present

## 2024-03-10 DIAGNOSIS — I071 Rheumatic tricuspid insufficiency: Secondary | ICD-10-CM | POA: Diagnosis not present

## 2024-03-10 MED ORDER — ATORVASTATIN CALCIUM 10 MG PO TABS: 10.0000 mg | ORAL_TABLET | Freq: Every day | ORAL | 3 refills | Status: AC

## 2024-03-10 MED ORDER — CARVEDILOL 6.25 MG PO TABS
6.2500 mg | ORAL_TABLET | Freq: Two times a day (BID) | ORAL | 3 refills | Status: DC
Start: 2024-03-10 — End: 2024-06-29

## 2024-03-10 NOTE — H&P (View-Only) (Signed)
 Cardiology Office Note:  .   Date:  03/11/2024  ID:  Douglas Fritz, DOB May 07, 1941, MRN [id number] PCP: Regino Slater, MD  [facility] Providers Cardiologist:  Gordy Bergamo, MD   History of Present Illness: .   Douglas Fritz is a 83 y.o. Caucasian male with history of hypertension, prostate cancer, Barrett's esophagus with permanent atrial fibrillation that was first noted during colonoscopy/endoscopy on 04/05/18. He is followed in our clinic for mitral regurgitation and tricuspid regurgitation as well.  His last echocardiogram on 02/18/2024 revealing normal LVEF at 55 to 60% with D-shaped septum suggestive of RV pressure and volume overload with moderate RV enlargement, mild MR and severe TR.  Discussed the use of AI scribe software for clinical note transcription with the patient, who gave verbal consent to proceed.  History of Present Illness Douglas Fritz is an 83 year old male with mitral valve prolapse and tricuspid regurgitation who presents with mild shortness of breath and heart palpitations. He experiences mild shortness of breath and a sensation of inadequate cardiac response when transitioning from a sedentary position to activity. These symptoms began after recovering from COVID-19 contracted last September. There is no orthopnea, chest pain, tachycardia, or syncope.  He has mitral valve prolapse with moderate to severe leak and severe tricuspid regurgitation. He is on Eliquis  for permanent atrial fibrillation to prevent strokes.  Labs   External Labs:  Care everywhere labs 02/22/2024:  Total cholesterol 114, triglycerides 43, HDL 48, LDL 56.  TSH normal at 3.48.  A1c 6.0%.  BUN 24, creatinine 0.88, EGFR 85 mL, potassium 4.3, LFTs normal.  Hb 14.4/HCT 43.0, platelets 225.  ROS  Review of Systems  Cardiovascular:  Positive for dyspnea on exertion. Negative for chest pain and leg swelling.    Physical Exam:   VS:  BP (!) 155/81 (BP Location: Left Arm, Patient Position:  Sitting, Cuff Size: Normal)   Pulse 74   Resp 16   Ht 5' 8 (1.727 m)   Wt 157 lb (71.2 kg)   SpO2 98%   BMI 23.87 kg/m    Wt Readings from Last 3 Encounters:  03/10/24 157 lb (71.2 kg)  02/18/23 163 lb 6.4 oz (74.1 kg)  08/07/22 168 lb (76.2 kg)    Physical Exam Neck:     Vascular: No JVD.   Cardiovascular:     Rate and Rhythm: Normal rate and regular rhythm.     Pulses: Intact distal pulses.     Heart sounds: S1 normal and S2 normal. Murmur heard.     High-pitched blowing holosystolic murmur is present with a grade of 2/6 at the lower right sternal border and apex.     No gallop.  Pulmonary:     Effort: Pulmonary effort is normal.     Breath sounds: Normal breath sounds.  Abdominal:     General: Bowel sounds are normal.     Palpations: Abdomen is soft.   Musculoskeletal:     Right lower leg: No edema.     Left lower leg: No edema.     ECHOCARDIOGRAM COMPLETE 02/18/2024  1. Left ventricular ejection fraction, by estimation, is 55 to 60%. The left ventricle has normal function. The left ventricle has no regional wall motion abnormalities. Left ventricular diastolic parameters were normal. 2. Mildly D-shaped septum, suggestive of a degree of RV pressure/volume overload. Right ventricular systolic function is normal. The right ventricular size is moderately enlarged. There is mildly elevated pulmonary artery systolic pressure. The estimated right ventricular systolic  pressure is 38.4 mmHg. 3. Left atrial size was severely dilated. 4. Right atrial size was severely dilated. 5. The mitral valve is abnormal with mild mitral valve prolapse noted. Mild mitral valve regurgitation. No evidence of mitral stenosis. 6. The tricuspid valve is abnormal. Tricuspid valve regurgitation is severe. 7. The aortic valve is tricuspid. Aortic valve regurgitation is not visualized. No aortic stenosis is present. 8. The inferior vena cava is dilated in size with <50% respiratory variability,  suggesting right atrial pressure of 15 mmHg.  EKG:    EKG Interpretation Date/Time:  Wednesday March 10 2024 11:45:33 EDT Ventricular Rate:  68 PR Interval:    QRS Duration:  104 QT Interval:  378 QTC Calculation: 401 R Axis:   -5  Text Interpretation: EKG 03/07/2024: Atrial fibrillation with controlled ventricular response at the rate of 68 bpm, incomplete right bundle branch block.  Nonspecific T abnormality. Confirmed by Roxy Filler, Jagadeesh [phone]) on 03/10/2024 11:53:16 AM  EKG 02/18/2023: Atrial fibrillation with controlled ventricular sponsor at rate of 67 bpm, normal axis, incomplete right bundle branch block. Nonspecific T abnormality. PVC (2).   Medications ordered    Meds ordered this encounter  Medications   carvedilol  (COREG ) 6.25 MG tablet    Sig: Take 1 tablet (6.25 mg total) by mouth 2 (two) times daily.    Dispense:  60 tablet    Refill:  3     ASSESSMENT AND PLAN: .      ICD-10-CM   1. Permanent atrial fibrillation (HCC)  I48.21 EKG 12-Lead    CBC    Basic Metabolic Panel (BMET)    2. MVP (mitral valve prolapse)  I34.1 CBC    Basic Metabolic Panel (BMET)    3. Severe tricuspid regurgitation  I07.1 CBC    Basic Metabolic Panel (BMET)    4. Essential hypertension  I10 carvedilol  (COREG ) 6.25 MG tablet    CBC    Basic Metabolic Panel (BMET)     Assessment & Plan Severe Tricuspid regurgitation with right ventricular enlargement Tricuspid regurgitation has worsened, leading to right ventricular enlargement, necessitating intervention to prevent progression to heart failure. Heart catheterization will assess heart pressures and check for arterial blockages. Open thoracotomy is preferred for valve repair to ensure optimal results and avoid reoperation. Schedule for cardiac catheterization. We discussed regarding risks, benefits, alternatives to this including stress testing, CTA and continued medical therapy. Patient wants to proceed. Understands <1-2% risk of death,  stroke, MI, urgent CABG, bleeding, infection, renal failure but not limited to these.  - Schedule heart catheterization to measure heart pressures and assess for arterial blockages. - Plan for ?open thoracotomy for valve repair, prioritizing mitral and tricuspid valves to be determined by CT Surgery. - Discuss risks of heart catheterization, including less than 1% risk of death, stroke, heart attack, or urgent bypass surgery. - Schedule for TEE as well for evaluation of MV and TV structure.   Mitral valve prolapse with mild regurgitation Mitral valve prolapse with moderate to severe regurgitation contributing to symptoms, requiring surgical intervention. Repair is preferred over replacement for optimal outcomes. - Include ? mitral valve repair in the planned open thoracotomy. - Discuss potential for repair rather than replacement of the valve.  Permanent atrial fibrillation Permanent atrial fibrillation managed with anticoagulation to prevent stroke. Consideration for left atrial appendage closure during surgery to potentially discontinue anticoagulation in the future. - Continue Eliquis  for anticoagulation, holding the day before and the day of surgery. - Discuss potential left atrial appendage closure with  the surgeon.  Hypertension Hypertension managed with medication. Additional medication initiated to address potential heart failure due to valve issues. - Continue amlodipine . - Initiate carvedilol  6.25 mg twice daily. - BMP and CBC reviewed, normal and stable to proceed with cardiac catheterization.  COVID-19 Residual mild shortness of breath, potentially related to cardiac issues rather than direct COVID-19 effects.   Signed,  Gordy Bergamo, MD, [facility] 03/11/2024, 2:46 AM [facility] [address] [place], [place] [address] Phone: [phone]. Fax:  [phone]

## 2024-03-10 NOTE — Progress Notes (Unsigned)
 Cardiology Office Note:  .   Date:  03/11/2024  ID:  Douglas Fritz, DOB 01-11-1941, MRN [id number] PCP: Lanae Pinal, MD  [facility] Providers Cardiologist:  Knox Perl, MD   History of Present Illness: .   Douglas Fritz is a 83 y.o. Caucasian male with history of hypertension, prostate cancer, Barrett's esophagus with permanent atrial fibrillation that was first noted during colonoscopy/endoscopy on 04/05/18. He is followed in our clinic for mitral regurgitation and tricuspid regurgitation as well.  His last echocardiogram on 02/18/2024 revealing normal LVEF at 55 to 60% with D-shaped septum suggestive of RV pressure and volume overload with moderate RV enlargement, mild MR and severe TR.  Discussed the use of AI scribe software for clinical note transcription with the patient, who gave verbal consent to proceed.  History of Present Illness Douglas Fritz is an 83 year old male with mitral valve prolapse and tricuspid regurgitation who presents with mild shortness of breath and heart palpitations. He experiences mild shortness of breath and a sensation of inadequate cardiac response when transitioning from a sedentary position to activity. These symptoms began after recovering from COVID-19 contracted last September. There is no orthopnea, chest pain, tachycardia, or syncope.  He has mitral valve prolapse with moderate to severe leak and severe tricuspid regurgitation. He is on Eliquis  for permanent atrial fibrillation to prevent strokes.  Labs   External Labs:  Care everywhere labs 02/22/2024:  Total cholesterol 114, triglycerides 43, HDL 48, LDL 56.  TSH normal at 3.48.  A1c 6.0%.  BUN 24, creatinine 0.88, EGFR 85 mL, potassium 4.3, LFTs normal.  Hb 14.4/HCT 43.0, platelets 225.  ROS  Review of Systems  Cardiovascular:  Positive for dyspnea on exertion. Negative for chest pain and leg swelling.    Physical Exam:   VS:  BP (!) 155/81 (BP Location: Left Arm, Patient Position:  Sitting, Cuff Size: Normal)   Pulse 74   Resp 16   Ht 5' 8 (1.727 m)   Wt 157 lb (71.2 kg)   SpO2 98%   BMI 23.87 kg/m    Wt Readings from Last 3 Encounters:  03/10/24 157 lb (71.2 kg)  02/18/23 163 lb 6.4 oz (74.1 kg)  08/07/22 168 lb (76.2 kg)    Physical Exam Neck:     Vascular: No JVD.   Cardiovascular:     Rate and Rhythm: Normal rate and regular rhythm.     Pulses: Intact distal pulses.     Heart sounds: S1 normal and S2 normal. Murmur heard.     High-pitched blowing holosystolic murmur is present with a grade of 2/6 at the lower right sternal border and apex.     No gallop.  Pulmonary:     Effort: Pulmonary effort is normal.     Breath sounds: Normal breath sounds.  Abdominal:     General: Bowel sounds are normal.     Palpations: Abdomen is soft.   Musculoskeletal:     Right lower leg: No edema.     Left lower leg: No edema.     ECHOCARDIOGRAM COMPLETE 02/18/2024  1. Left ventricular ejection fraction, by estimation, is 55 to 60%. The left ventricle has normal function. The left ventricle has no regional wall motion abnormalities. Left ventricular diastolic parameters were normal. 2. Mildly D-shaped septum, suggestive of a degree of RV pressure/volume overload. Right ventricular systolic function is normal. The right ventricular size is moderately enlarged. There is mildly elevated pulmonary artery systolic pressure. The estimated right ventricular systolic  pressure is 38.4 mmHg. 3. Left atrial size was severely dilated. 4. Right atrial size was severely dilated. 5. The mitral valve is abnormal with mild mitral valve prolapse noted. Mild mitral valve regurgitation. No evidence of mitral stenosis. 6. The tricuspid valve is abnormal. Tricuspid valve regurgitation is severe. 7. The aortic valve is tricuspid. Aortic valve regurgitation is not visualized. No aortic stenosis is present. 8. The inferior vena cava is dilated in size with <50% respiratory variability,  suggesting right atrial pressure of 15 mmHg.  EKG:    EKG Interpretation Date/Time:  Wednesday March 10 2024 11:45:33 EDT Ventricular Rate:  68 PR Interval:    QRS Duration:  104 QT Interval:  378 QTC Calculation: 401 R Axis:   -5  Text Interpretation: EKG 03/07/2024: Atrial fibrillation with controlled ventricular response at the rate of 68 bpm, incomplete right bundle branch block.  Nonspecific T abnormality. Confirmed by Sipriano Fendley, Jagadeesh [phone]) on 03/10/2024 11:53:16 AM  EKG 02/18/2023: Atrial fibrillation with controlled ventricular sponsor at rate of 67 bpm, normal axis, incomplete right bundle branch block. Nonspecific T abnormality. PVC (2).   Medications ordered    Meds ordered this encounter  Medications   carvedilol (COREG) 6.25 MG tablet    Sig: Take 1 tablet (6.25 mg total) by mouth 2 (two) times daily.    Dispense:  60 tablet    Refill:  3     ASSESSMENT AND PLAN: .      ICD-10-CM   1. Permanent atrial fibrillation (HCC)  I48.21 EKG 12-Lead    CBC    Basic Metabolic Panel (BMET)    2. MVP (mitral valve prolapse)  I34.1 CBC    Basic Metabolic Panel (BMET)    3. Severe tricuspid regurgitation  I07.1 CBC    Basic Metabolic Panel (BMET)    4. Essential hypertension  I10 carvedilol (COREG) 6.25 MG tablet    CBC    Basic Metabolic Panel (BMET)     Assessment & Plan Severe Tricuspid regurgitation with right ventricular enlargement Tricuspid regurgitation has worsened, leading to right ventricular enlargement, necessitating intervention to prevent progression to heart failure. Heart catheterization will assess heart pressures and check for arterial blockages. Open thoracotomy is preferred for valve repair to ensure optimal results and avoid reoperation. Schedule for cardiac catheterization. We discussed regarding risks, benefits, alternatives to this including stress testing, CTA and continued medical therapy. Patient wants to proceed. Understands <1-2% risk of death,  stroke, MI, urgent CABG, bleeding, infection, renal failure but not limited to these.  - Schedule heart catheterization to measure heart pressures and assess for arterial blockages. - Plan for ?open thoracotomy for valve repair, prioritizing mitral and tricuspid valves to be determined by CT Surgery. - Discuss risks of heart catheterization, including less than 1% risk of death, stroke, heart attack, or urgent bypass surgery. - Schedule for TEE as well for evaluation of MV and TV structure.   Mitral valve prolapse with mild regurgitation Mitral valve prolapse with moderate to severe regurgitation contributing to symptoms, requiring surgical intervention. Repair is preferred over replacement for optimal outcomes. - Include ? mitral valve repair in the planned open thoracotomy. - Discuss potential for repair rather than replacement of the valve.  Permanent atrial fibrillation Permanent atrial fibrillation managed with anticoagulation to prevent stroke. Consideration for left atrial appendage closure during surgery to potentially discontinue anticoagulation in the future. - Continue Eliquis  for anticoagulation, holding the day before and the day of surgery. - Discuss potential left atrial appendage closure with  the surgeon.  Hypertension Hypertension managed with medication. Additional medication initiated to address potential heart failure due to valve issues. - Continue amlodipine . - Initiate carvedilol 6.25 mg twice daily. - BMP and CBC reviewed, normal and stable to proceed with cardiac catheterization.  COVID-19 Residual mild shortness of breath, potentially related to cardiac issues rather than direct COVID-19 effects.   Signed,  Knox Perl, MD, [facility] 03/11/2024, 2:46 AM [facility] [address] [place], [place] [address] Phone: [phone]. Fax:  [phone]

## 2024-03-10 NOTE — Patient Instructions (Addendum)
 Medication Instructions:  Your physician has recommended you make the following change in your medication:  Start Carvedilol 6.25 mg by mouth twice daily   *If you need a refill on your cardiac medications before your next appointment, please call your pharmacy*  Lab Work: Have lab work drawn today at lab on the first floor-- BMP and CBC If you have labs (blood work) drawn today and your tests are completely normal, you will receive your results only by: MyChart Message (if you have MyChart) OR A paper copy in the mail If you have any lab test that is abnormal or we need to change your treatment, we will call you to review the results.  Testing/Procedures: Your physician has requested that you have a cardiac catheterization. Cardiac catheterization is used to diagnose and/or treat various heart conditions. Doctors may recommend this procedure for a number of different reasons. The most common reason is to evaluate chest pain. Chest pain can be a symptom of coronary artery disease (CAD), and cardiac catheterization can show whether plaque is narrowing or blocking your heart's arteries. This procedure is also used to evaluate the valves, as well as measure the blood flow and oxygen levels in different parts of your heart. For further information please visit [email or web address] Please follow instruction sheet, as given. Scheduled for July 2  Follow-Up: At [facility], you and your health needs are our priority.  As part of our continuing mission to provide you with exceptional heart care, our providers are all part of one team.  This team includes your primary Cardiologist (physician) and Advanced Practice Providers or APPs (Physician Assistants and Nurse Practitioners) who all work together to provide you with the care you need, when you need it.  Your next appointment:   August 14 at 8:40    Provider:   Dr Berry Bristol   We recommend signing up for the patient portal called MyChart.   Sign up information is provided on this After Visit Summary.  MyChart is used to connect with patients for Virtual Visits (Telemedicine).  Patients are able to view lab/test results, encounter notes, upcoming appointments, etc.  Non-urgent messages can be sent to your provider as well.   To learn more about what you can do with MyChart, go to [email or web address].   Other Instructions  [facility] A DEPT OF [facility] [address] A DEPT OF THE [place] [address] [place] [address] Dept: [phone] Loc: [phone]  Douglas Fritz  03/10/2024  You are scheduled for a Cardiac Catheterization on Wednesday, July 2 with Dr. Knox Perl.  1. Please arrive at the [facility] (Main Entrance A) at [facility]: [address] [place], [place] [address] at 8:00 AM (This time is 2 hour(s) before your procedure to ensure your preparation).   Free valet parking service is available. You will check in at ADMITTING. The support person will be asked to wait in the waiting room.  It is OK to have someone drop you off and come back when you are ready to be discharged.    Special note: Every effort is made to have your procedure done on time. Please understand that emergencies sometimes delay scheduled procedures.  2. Diet: Do not eat solid foods after midnight.  The patient may have clear liquids until 5am upon the day of the procedure.  3. Labs: done on June 18  4. Medication instructions in preparation for your procedure:  Do not take Eliquis  the day before the procedure and the day of the procedure Do not take Valsartan  hydrochlorothiazide  the day of the procedure   Contrast Allergy: No   On the morning of your procedure, take your Aspirin 81 mg and any morning medicines NOT listed above.  You may use sips of water.  5. Plan to go home the same day, you will only stay overnight if medically necessary. 6. Bring a current list  of your medications and current insurance cards. 7. You MUST have a responsible person to drive you home. 8. Someone MUST be with you the first 24 hours after you arrive home or your discharge will be delayed. 9. Please wear clothes that are easy to get on and off and wear slip-on shoes.  Thank you for allowing us  to care for you!   -- [facility]

## 2024-03-11 ENCOUNTER — Ambulatory Visit: Payer: Self-pay | Admitting: Cardiology

## 2024-03-11 LAB — CBC
Hematocrit: 44.1 % (ref 37.5–51.0)
Hemoglobin: 14.7 g/dL (ref 13.0–17.7)
MCH: 34.4 pg — ABNORMAL HIGH (ref 26.6–33.0)
MCHC: 33.3 g/dL (ref 31.5–35.7)
MCV: 103 fL — ABNORMAL HIGH (ref 79–97)
Platelets: 142 10*3/uL — ABNORMAL LOW (ref 150–450)
RBC: 4.27 x10E6/uL (ref 4.14–5.80)
RDW: 12.6 % (ref 11.6–15.4)
WBC: 5.6 10*3/uL (ref 3.4–10.8)

## 2024-03-11 LAB — BASIC METABOLIC PANEL WITH GFR
BUN/Creatinine Ratio: 19 (ref 10–24)
BUN: 18 mg/dL (ref 8–27)
CO2: 21 mmol/L (ref 20–29)
Calcium: 9.8 mg/dL (ref 8.6–10.2)
Chloride: 98 mmol/L (ref 96–106)
Creatinine, Ser: 0.94 mg/dL (ref 0.76–1.27)
Glucose: 95 mg/dL (ref 70–99)
Potassium: 4.7 mmol/L (ref 3.5–5.2)
Sodium: 136 mmol/L (ref 134–144)
eGFR: 80 mL/min/{1.73_m2} (ref >59)

## 2024-03-11 NOTE — Progress Notes (Signed)
 Please call the patient and let him know that I will also be scheduling for a TEE and possibly try to get this done as same day as my heart catheterization.  If unable to add TEE, you could change the date of her heart catheterization.

## 2024-03-12 NOTE — Telephone Encounter (Signed)
 I spoke with patient and reviewed TEE instructions with him.  Will send instructions to him through my chart

## 2024-03-12 NOTE — Telephone Encounter (Signed)
 Left message to call office

## 2024-03-16 DIAGNOSIS — I272 Pulmonary hypertension, unspecified: Secondary | ICD-10-CM | POA: Diagnosis not present

## 2024-03-16 DIAGNOSIS — I4821 Permanent atrial fibrillation: Secondary | ICD-10-CM | POA: Diagnosis not present

## 2024-03-16 DIAGNOSIS — R7301 Impaired fasting glucose: Secondary | ICD-10-CM | POA: Diagnosis not present

## 2024-03-16 DIAGNOSIS — Z79899 Other long term (current) drug therapy: Secondary | ICD-10-CM | POA: Diagnosis not present

## 2024-03-16 DIAGNOSIS — I10 Essential (primary) hypertension: Secondary | ICD-10-CM | POA: Diagnosis not present

## 2024-03-16 DIAGNOSIS — E7800 Pure hypercholesterolemia, unspecified: Secondary | ICD-10-CM | POA: Diagnosis not present

## 2024-03-16 DIAGNOSIS — Z1331 Encounter for screening for depression: Secondary | ICD-10-CM | POA: Diagnosis not present

## 2024-03-16 DIAGNOSIS — R7303 Prediabetes: Secondary | ICD-10-CM | POA: Diagnosis not present

## 2024-03-16 DIAGNOSIS — Z0001 Encounter for general adult medical examination with abnormal findings: Secondary | ICD-10-CM | POA: Diagnosis not present

## 2024-03-16 DIAGNOSIS — F411 Generalized anxiety disorder: Secondary | ICD-10-CM | POA: Diagnosis not present

## 2024-03-19 ENCOUNTER — Encounter: Payer: Self-pay | Admitting: Cardiology

## 2024-03-19 NOTE — Telephone Encounter (Signed)
 Called patient about message. Patient stated he does not feel terrible, but just sluggish, and this medications is making him not want to go to the gym. Informed patient if he starts to feel bad to hold his coreg  until he hears back from Dr. Ladona. Patient stated that he will continue for now. Will send to Dr. Ladona for advisement.

## 2024-03-23 ENCOUNTER — Other Ambulatory Visit: Payer: Self-pay | Admitting: Cardiology

## 2024-03-23 ENCOUNTER — Telehealth: Payer: Self-pay | Admitting: *Deleted

## 2024-03-23 NOTE — Anesthesia Preprocedure Evaluation (Signed)
 Anesthesia Evaluation  Patient identified by MRN, date of birth, ID band Patient awake    Reviewed: Allergy & Precautions, H&P , NPO status , Patient's Chart, lab work & pertinent test results  Airway Mallampati: II  TM Distance: >3 FB Neck ROM: Full    Dental no notable dental hx. (+) Teeth Intact, Dental Advisory Given   Pulmonary neg pulmonary ROS   Pulmonary exam normal breath sounds clear to auscultation       Cardiovascular Exercise Tolerance: Good hypertension, Pt. on medications and Pt. on home beta blockers + Valvular Problems/Murmurs MR  Rhythm:Regular Rate:Normal     Neuro/Psych   Anxiety Depression    negative neurological ROS     GI/Hepatic negative GI ROS, Neg liver ROS,,,  Endo/Other  negative endocrine ROS    Renal/GU negative Renal ROS  negative genitourinary   Musculoskeletal   Abdominal   Peds  Hematology negative hematology ROS (+)   Anesthesia Other Findings   Reproductive/Obstetrics negative OB ROS                              Anesthesia Physical Anesthesia Plan  ASA: 3  Anesthesia Plan: MAC   Post-op Pain Management: Minimal or no pain anticipated   Induction: Intravenous  PONV Risk Score and Plan: 1 and Propofol  infusion  Airway Management Planned: Natural Airway and Simple Face Mask  Additional Equipment:   Intra-op Plan:   Post-operative Plan:   Informed Consent: I have reviewed the patients History and Physical, chart, labs and discussed the procedure including the risks, benefits and alternatives for the proposed anesthesia with the patient or authorized representative who has indicated his/her understanding and acceptance.     Dental advisory given  Plan Discussed with: CRNA  Anesthesia Plan Comments:         Anesthesia Quick Evaluation

## 2024-03-23 NOTE — Telephone Encounter (Addendum)
 Cardiac Catheterization scheduled at [facility] for: Wednesday March 24, 2024 10:30 AM/TEE 8:30 AM Arrival time [facility] Main Entrance A at: 7:30 AM  Nothing to eat or drink after midnight, may have sips of water for medications  Medication instructions: -Hold:  Eliquis -day before and day of procedure per instructions 03/10/24  Valsartan /HCT-AM of procedure -Other usual morning medications can be taken with sips of water including aspirin 81 mg.  Plan to go home the same day, you will only stay overnight if medically necessary.  You must have responsible adult to drive you home.  Someone must be with you the first 24 hours after you arrive home.  Reviewed procedure instructions with patient.  Per Dr Ladona 03/19/24 -Agree to discontinue coreg  please for now

## 2024-03-24 ENCOUNTER — Encounter (HOSPITAL_COMMUNITY): Payer: Self-pay | Admitting: Anesthesiology

## 2024-03-24 ENCOUNTER — Other Ambulatory Visit: Payer: Self-pay

## 2024-03-24 ENCOUNTER — Ambulatory Visit (HOSPITAL_COMMUNITY)
Admission: RE | Admit: 2024-03-24 | Discharge: 2024-03-24 | Disposition: A | Discharge status: Home / Self Care | Attending: Cardiology | Admitting: Cardiology

## 2024-03-24 ENCOUNTER — Ambulatory Visit (HOSPITAL_COMMUNITY)

## 2024-03-24 ENCOUNTER — Encounter (HOSPITAL_COMMUNITY)
Admission: RE | Disposition: A | Discharge status: Home / Self Care | Payer: Self-pay | Source: Home / Self Care | Attending: Cardiology

## 2024-03-24 ENCOUNTER — Ambulatory Visit (HOSPITAL_COMMUNITY): Payer: Self-pay | Admitting: Anesthesiology

## 2024-03-24 DIAGNOSIS — F418 Other specified anxiety disorders: Secondary | ICD-10-CM

## 2024-03-24 DIAGNOSIS — I2510 Atherosclerotic heart disease of native coronary artery without angina pectoris: Secondary | ICD-10-CM

## 2024-03-24 DIAGNOSIS — I272 Pulmonary hypertension, unspecified: Secondary | ICD-10-CM | POA: Diagnosis not present

## 2024-03-24 DIAGNOSIS — I10 Essential (primary) hypertension: Secondary | ICD-10-CM

## 2024-03-24 DIAGNOSIS — I361 Nonrheumatic tricuspid (valve) insufficiency: Secondary | ICD-10-CM

## 2024-03-24 DIAGNOSIS — I071 Rheumatic tricuspid insufficiency: Secondary | ICD-10-CM | POA: Diagnosis not present

## 2024-03-24 DIAGNOSIS — Z79899 Other long term (current) drug therapy: Secondary | ICD-10-CM | POA: Diagnosis not present

## 2024-03-24 DIAGNOSIS — I081 Rheumatic disorders of both mitral and tricuspid valves: Secondary | ICD-10-CM

## 2024-03-24 DIAGNOSIS — I2584 Coronary atherosclerosis due to calcified coronary lesion: Secondary | ICD-10-CM | POA: Insufficient documentation

## 2024-03-24 DIAGNOSIS — I340 Nonrheumatic mitral (valve) insufficiency: Secondary | ICD-10-CM | POA: Diagnosis not present

## 2024-03-24 DIAGNOSIS — Z7901 Long term (current) use of anticoagulants: Secondary | ICD-10-CM | POA: Insufficient documentation

## 2024-03-24 DIAGNOSIS — I341 Nonrheumatic mitral (valve) prolapse: Secondary | ICD-10-CM | POA: Diagnosis not present

## 2024-03-24 DIAGNOSIS — Z8546 Personal history of malignant neoplasm of prostate: Secondary | ICD-10-CM | POA: Insufficient documentation

## 2024-03-24 DIAGNOSIS — I4821 Permanent atrial fibrillation: Secondary | ICD-10-CM | POA: Diagnosis not present

## 2024-03-24 DIAGNOSIS — Z8616 Personal history of COVID-19: Secondary | ICD-10-CM | POA: Diagnosis not present

## 2024-03-24 HISTORY — PX: TRANSESOPHAGEAL ECHOCARDIOGRAM (CATH LAB): EP1270

## 2024-03-24 HISTORY — PX: RIGHT/LEFT HEART CATH AND CORONARY ANGIOGRAPHY: CATH118266

## 2024-03-24 LAB — ECHO TEE
AV Mean grad: 2 mmHg
AV Peak grad: 4.8 mmHg
Ao pk vel: 1.1 m/s

## 2024-03-24 LAB — POCT I-STAT EG7
Acid-base deficit: 2 mmol/L (ref 0.0–2.0)
Acid-base deficit: 2 mmol/L (ref 0.0–2.0)
Bicarbonate: 23.2 mmol/L (ref 20.0–28.0)
Bicarbonate: 23.4 mmol/L (ref 20.0–28.0)
Calcium, Ion: 1.21 mmol/L (ref 1.15–1.40)
Calcium, Ion: 1.22 mmol/L (ref 1.15–1.40)
HCT: 39 % (ref 39.0–52.0)
HCT: 39 % (ref 39.0–52.0)
Hemoglobin: 13.3 g/dL (ref 13.0–17.0)
Hemoglobin: 13.3 g/dL (ref 13.0–17.0)
O2 Saturation: 64 %
O2 Saturation: 67 %
Potassium: 3.9 mmol/L (ref 3.5–5.1)
Potassium: 3.9 mmol/L (ref 3.5–5.1)
Sodium: 137 mmol/L (ref 135–145)
Sodium: 138 mmol/L (ref 135–145)
TCO2: 24 mmol/L (ref 22–32)
TCO2: 25 mmol/L (ref 22–32)
pCO2, Ven: 39.4 mmHg — ABNORMAL LOW (ref 44–60)
pCO2, Ven: 39.8 mmHg — ABNORMAL LOW (ref 44–60)
pH, Ven: 7.377 (ref 7.25–7.43)
pH, Ven: 7.379 (ref 7.25–7.43)
pO2, Ven: 34 mmHg (ref 32–45)
pO2, Ven: 36 mmHg (ref 32–45)

## 2024-03-24 LAB — POCT I-STAT 7, (LYTES, BLD GAS, ICA,H+H)
Acid-base deficit: 3 mmol/L — ABNORMAL HIGH (ref 0.0–2.0)
Bicarbonate: 21.7 mmol/L (ref 20.0–28.0)
Calcium, Ion: 1.19 mmol/L (ref 1.15–1.40)
HCT: 38 % — ABNORMAL LOW (ref 39.0–52.0)
Hemoglobin: 12.9 g/dL — ABNORMAL LOW (ref 13.0–17.0)
O2 Saturation: 90 %
Potassium: 3.9 mmol/L (ref 3.5–5.1)
Sodium: 137 mmol/L (ref 135–145)
TCO2: 23 mmol/L (ref 22–32)
pCO2 arterial: 36 mmHg (ref 32–48)
pH, Arterial: 7.388 (ref 7.35–7.45)
pO2, Arterial: 60 mmHg — ABNORMAL LOW (ref 83–108)

## 2024-03-24 SURGERY — RIGHT/LEFT HEART CATH AND CORONARY ANGIOGRAPHY
Anesthesia: LOCAL

## 2024-03-24 SURGERY — TRANSESOPHAGEAL ECHOCARDIOGRAM (TEE) (CATHLAB)
Anesthesia: Monitor Anesthesia Care

## 2024-03-24 MED ORDER — PROPOFOL 500 MG/50ML IV EMUL
INTRAVENOUS | Status: DC | PRN
  Administered 2024-03-24: 75 ug/kg/min via INTRAVENOUS

## 2024-03-24 MED ORDER — PROPOFOL 10 MG/ML IV BOLUS
INTRAVENOUS | Status: DC | PRN
  Administered 2024-03-24: 50 mg via INTRAVENOUS

## 2024-03-24 MED ORDER — SODIUM CHLORIDE 0.9% FLUSH: 3.0000 mL | INTRAVENOUS | Status: DC | PRN

## 2024-03-24 MED ORDER — ELIQUIS 5 MG PO TABS: 5.0000 mg | ORAL_TABLET | Freq: Two times a day (BID) | ORAL | Status: DC

## 2024-03-24 MED ORDER — HEPARIN SODIUM (PORCINE) 1000 UNIT/ML IJ SOLN
INTRAMUSCULAR | Status: DC | PRN
  Administered 2024-03-24: 3500 [IU] via INTRAVENOUS

## 2024-03-24 MED ORDER — SODIUM CHLORIDE 0.9 % IV SOLN: 250.0000 mL | INTRAVENOUS | Status: DC | PRN

## 2024-03-24 MED ORDER — ASPIRIN 81 MG PO CHEW: 81.0000 mg | CHEWABLE_TABLET | ORAL | Status: DC

## 2024-03-24 MED ORDER — MIDAZOLAM HCL 2 MG/2ML IJ SOLN
INTRAMUSCULAR | Status: AC
  Filled 2024-03-24: qty 2

## 2024-03-24 MED ORDER — LIDOCAINE 2% (20 MG/ML) 5 ML SYRINGE
INTRAMUSCULAR | Status: DC | PRN
  Administered 2024-03-24: 100 mg via INTRAVENOUS

## 2024-03-24 MED ORDER — FENTANYL CITRATE (PF) 100 MCG/2ML IJ SOLN
INTRAMUSCULAR | Status: DC | PRN
  Administered 2024-03-24: 25 ug via INTRAVENOUS

## 2024-03-24 MED ORDER — LIDOCAINE HCL (PF) 1 % IJ SOLN
INTRAMUSCULAR | Status: AC
Start: 2024-03-24 — End: 2024-03-24
  Filled 2024-03-24: qty 30

## 2024-03-24 MED ORDER — IOHEXOL 350 MG/ML SOLN
INTRAVENOUS | Status: DC | PRN
Start: 2024-03-24 — End: 2024-03-24
  Administered 2024-03-24: 40 mL

## 2024-03-24 MED ORDER — ACETAMINOPHEN 325 MG PO TABS: 650.0000 mg | ORAL_TABLET | ORAL | Status: DC | PRN

## 2024-03-24 MED ORDER — VERAPAMIL HCL 2.5 MG/ML IV SOLN
INTRAVENOUS | Status: DC | PRN
  Administered 2024-03-24: 10 mL via INTRA_ARTERIAL

## 2024-03-24 MED ORDER — SODIUM CHLORIDE 0.9 % IV SOLN: INTRAVENOUS | Status: DC

## 2024-03-24 MED ORDER — SODIUM CHLORIDE 0.9 % WEIGHT BASED INFUSION: 3.0000 mL/kg/h | INTRAVENOUS | Status: DC

## 2024-03-24 MED ORDER — ONDANSETRON HCL 4 MG/2ML IJ SOLN: 4.0000 mg | Freq: Four times a day (QID) | INTRAMUSCULAR | Status: DC | PRN

## 2024-03-24 MED ORDER — LIDOCAINE HCL (PF) 1 % IJ SOLN
INTRAMUSCULAR | Status: DC | PRN
  Administered 2024-03-24: 5 mL
  Administered 2024-03-24: 2 mL

## 2024-03-24 MED ORDER — HEPARIN SODIUM (PORCINE) 1000 UNIT/ML IJ SOLN
INTRAMUSCULAR | Status: AC
  Filled 2024-03-24: qty 10

## 2024-03-24 MED ORDER — MIDAZOLAM HCL 2 MG/2ML IJ SOLN
INTRAMUSCULAR | Status: DC | PRN
  Administered 2024-03-24: 1 mg via INTRAVENOUS

## 2024-03-24 MED ORDER — SODIUM CHLORIDE 0.9 % WEIGHT BASED INFUSION: 1.0000 mL/kg/h | INTRAVENOUS | Status: DC

## 2024-03-24 MED ORDER — EPHEDRINE SULFATE-NACL 50-0.9 MG/10ML-% IV SOSY
PREFILLED_SYRINGE | INTRAVENOUS | Status: DC | PRN
  Administered 2024-03-24: 5 mg via INTRAVENOUS
  Administered 2024-03-24 (×2): 10 mg via INTRAVENOUS

## 2024-03-24 MED ORDER — FENTANYL CITRATE (PF) 100 MCG/2ML IJ SOLN
INTRAMUSCULAR | Status: AC
  Filled 2024-03-24: qty 2

## 2024-03-24 MED ORDER — HEPARIN (PORCINE) IN NACL 1000-0.9 UT/500ML-% IV SOLN
INTRAVENOUS | Status: DC | PRN
  Administered 2024-03-24 (×2): 500 mL

## 2024-03-24 MED ORDER — VERAPAMIL HCL 2.5 MG/ML IV SOLN
INTRAVENOUS | Status: AC
  Filled 2024-03-24: qty 2

## 2024-03-24 SURGICAL SUPPLY — 11 items
CATH 5FR JL3.5 JR4 ANG PIG MP (CATHETERS) IMPLANT
CATH BALLN WEDGE 5F 110CM (CATHETERS) IMPLANT
DEVICE RAD COMP TR BAND LRG (VASCULAR PRODUCTS) IMPLANT
GLIDESHEATH SLEND A-KIT 6F 22G (SHEATH) IMPLANT
GUIDEWIRE .025 260CM (WIRE) IMPLANT
GUIDEWIRE INQWIRE 1.5J.035X260 (WIRE) IMPLANT
KIT SYRINGE INJ CVI SPIKEX1 (MISCELLANEOUS) IMPLANT
PACK CARDIAC CATHETERIZATION (CUSTOM PROCEDURE TRAY) ×1 IMPLANT
SET ATX-X65L (MISCELLANEOUS) IMPLANT
SHEATH GLIDE SLENDER 4/5FR (SHEATH) IMPLANT
WIRE EMERALD 3MM-J .025X260CM (WIRE) IMPLANT

## 2024-03-24 NOTE — Interval H&P Note (Signed)
 History and Physical Interval Note:  03/24/2024 11:18 AM  Douglas Fritz  has presented today for surgery, with the diagnosis of mr.  The various methods of treatment have been discussed with the patient and family. After consideration of risks, benefits and other options for treatment, the patient has consented to  Procedure(s): RIGHT/LEFT HEART CATH AND CORONARY ANGIOGRAPHY (N/A) as a surgical intervention.  The patient's history has been reviewed, patient examined, no change in status, stable for surgery.  I have reviewed the patient's chart and labs.  Questions were answered to the patient's satisfaction.     Gordy Bergamo

## 2024-03-24 NOTE — Transfer of Care (Signed)
 Immediate Anesthesia Transfer of Care Note  Patient: Douglas Fritz  Procedure(s) Performed: TRANSESOPHAGEAL ECHOCARDIOGRAM  Patient Location: PACU and Cath Lab  Anesthesia Type:MAC  Level of Consciousness: sedated  Airway & Oxygen Therapy: Patient Spontanous Breathing and Patient connected to face mask oxygen  Post-op Assessment: Report given to RN and Post -op Vital signs reviewed and stable  Post vital signs: Reviewed and stable  Last Vitals:  Vitals Value Taken Time  BP 104/72 03/24/24 10:54  Temp    Pulse 53 03/24/24 10:56  Resp 22 03/24/24 10:56  SpO2 100 % 03/24/24 10:56  Vitals shown include unfiled device data.  Last Pain:  Vitals:   03/24/24 0851  TempSrc:   PainSc: 0-No pain         Complications: No notable events documented.

## 2024-03-24 NOTE — Interval H&P Note (Signed)
 History and Physical Interval Note:  03/24/2024 9:37 AM  Douglas Fritz  has presented today for surgery, with the diagnosis of MITRAL REGURGITATION, TRICUSPID VALVE INSUFFICIENCY.  The various methods of treatment have been discussed with the patient and family. After consideration of risks, benefits and other options for treatment, the patient has consented to  Procedure(s): TRANSESOPHAGEAL ECHOCARDIOGRAM (N/A) as a surgical intervention.  The patient's history has been reviewed, patient examined, no change in status, stable for surgery.  I have reviewed the patient's chart and labs.  Questions were answered to the patient's satisfaction.     [employer]

## 2024-03-24 NOTE — CV Procedure (Signed)
   Transesophageal Echocardiogram  Indications: Severe TR, assess MR, LAA  Time out performed  During this procedure the patient was administered propofol  under anesthesiology supervision to achieve and maintain moderate sedation.  The patient's heart rate, blood pressure, and oxygen saturation are monitored continuously during the procedure.   Findings:  Left Ventricle: Ejection fraction 55% normal, D-shaped septum compatible with increased pulmonary pressures/volume overload.  Mitral Valve: Mild to moderate mitral valve regurgitation  Aortic Valve: Trileaflet, no stenosis  Tricuspid Valve: Trileaflet, massive tricuspid regurgitation, see report for measurements  Left Atrium: Severely dilated, no left atrial appendage thrombus, see report for left atrial appendage measurements  Right Atrium: Severely dilated  Intraatrial septum: No shunt  Bubble Contrast Study: Negative bubble study   Impression: - Mild to moderate mitral regurgitation - Massive tricuspid regurgitation -Getting cardiac catheterization today.  Oneil Parchment, MD

## 2024-03-24 NOTE — Anesthesia Postprocedure Evaluation (Signed)
 Anesthesia Post Note  Patient: Douglas Fritz  Procedure(s) Performed: TRANSESOPHAGEAL ECHOCARDIOGRAM     Patient location during evaluation: Cath Lab Anesthesia Type: MAC Level of consciousness: awake and alert Pain management: pain level controlled Vital Signs Assessment: post-procedure vital signs reviewed and stable Respiratory status: spontaneous breathing, nonlabored ventilation, respiratory function stable and patient connected to nasal cannula oxygen Cardiovascular status: stable and blood pressure returned to baseline Postop Assessment: no apparent nausea or vomiting Anesthetic complications: no   No notable events documented.  Last Vitals:  Vitals:   03/24/24 1110 03/24/24 1120  BP: 105/67   Pulse:    Resp:    Temp:    SpO2:  93%    Last Pain:  Vitals:   03/24/24 1120  TempSrc:   PainSc: 0-No pain                 [name],W. EDMOND

## 2024-03-24 NOTE — Discharge Instructions (Signed)

## 2024-03-25 ENCOUNTER — Encounter (HOSPITAL_COMMUNITY): Payer: Self-pay | Admitting: Cardiology

## 2024-03-28 ENCOUNTER — Encounter: Payer: Self-pay | Admitting: Cardiology

## 2024-04-07 ENCOUNTER — Encounter: Payer: Self-pay | Admitting: Cardiology

## 2024-04-08 NOTE — Telephone Encounter (Signed)
 Lasix 20 mg daily with kcl 20 meq daily for 1 week followed by daily PRN for weight gain of > 3 Lbs in 3-5 days.  Rx 30 tabs with no refills

## 2024-04-09 MED ORDER — FUROSEMIDE 20 MG PO TABS: ORAL_TABLET | ORAL | 0 refills | Status: DC

## 2024-04-09 MED ORDER — POTASSIUM CHLORIDE CRYS ER 20 MEQ PO TBCR: EXTENDED_RELEASE_TABLET | ORAL | 0 refills | Status: DC

## 2024-04-13 NOTE — Telephone Encounter (Signed)
 Please see pt msg

## 2024-04-22 ENCOUNTER — Encounter: Payer: Self-pay | Admitting: Cardiology

## 2024-04-30 DIAGNOSIS — I361 Nonrheumatic tricuspid (valve) insufficiency: Secondary | ICD-10-CM | POA: Diagnosis not present

## 2024-04-30 DIAGNOSIS — I4519 Other right bundle-branch block: Secondary | ICD-10-CM | POA: Diagnosis not present

## 2024-04-30 DIAGNOSIS — I4891 Unspecified atrial fibrillation: Secondary | ICD-10-CM | POA: Diagnosis not present

## 2024-04-30 DIAGNOSIS — I110 Hypertensive heart disease with heart failure: Secondary | ICD-10-CM | POA: Diagnosis not present

## 2024-04-30 DIAGNOSIS — I2510 Atherosclerotic heart disease of native coronary artery without angina pectoris: Secondary | ICD-10-CM | POA: Diagnosis not present

## 2024-04-30 DIAGNOSIS — I5032 Chronic diastolic (congestive) heart failure: Secondary | ICD-10-CM | POA: Diagnosis not present

## 2024-04-30 DIAGNOSIS — I272 Pulmonary hypertension, unspecified: Secondary | ICD-10-CM | POA: Diagnosis not present

## 2024-04-30 DIAGNOSIS — Z7901 Long term (current) use of anticoagulants: Secondary | ICD-10-CM | POA: Diagnosis not present

## 2024-04-30 DIAGNOSIS — I480 Paroxysmal atrial fibrillation: Secondary | ICD-10-CM | POA: Diagnosis not present

## 2024-05-02 ENCOUNTER — Encounter: Payer: Self-pay | Admitting: Cardiology

## 2024-05-03 ENCOUNTER — Other Ambulatory Visit: Payer: Self-pay

## 2024-05-03 DIAGNOSIS — I341 Nonrheumatic mitral (valve) prolapse: Secondary | ICD-10-CM

## 2024-05-03 MED ORDER — FUROSEMIDE 20 MG PO TABS: ORAL_TABLET | ORAL | 3 refills | Status: DC

## 2024-05-03 MED ORDER — POTASSIUM CHLORIDE CRYS ER 20 MEQ PO TBCR: EXTENDED_RELEASE_TABLET | ORAL | 3 refills | Status: DC

## 2024-05-03 MED ORDER — ELIQUIS 5 MG PO TABS: 5.0000 mg | ORAL_TABLET | Freq: Two times a day (BID) | ORAL | 5 refills | Status: AC

## 2024-05-03 NOTE — Telephone Encounter (Signed)
 Will change his appointment to 29-month follow-up, there are plans for him to have tricuspid valve repair in [place], I have reviewed the notes.

## 2024-05-06 ENCOUNTER — Ambulatory Visit: Admitting: Cardiology

## 2024-05-06 DIAGNOSIS — I071 Rheumatic tricuspid insufficiency: Secondary | ICD-10-CM | POA: Diagnosis not present

## 2024-05-06 DIAGNOSIS — I361 Nonrheumatic tricuspid (valve) insufficiency: Secondary | ICD-10-CM | POA: Diagnosis not present

## 2024-05-13 DIAGNOSIS — I081 Rheumatic disorders of both mitral and tricuspid valves: Secondary | ICD-10-CM | POA: Diagnosis not present

## 2024-05-20 ENCOUNTER — Encounter: Payer: Self-pay | Admitting: Cardiology

## 2024-06-29 ENCOUNTER — Ambulatory Visit (HOSPITAL_COMMUNITY)
Admission: RE | Admit: 2024-06-29 | Discharge: 2024-06-29 | Disposition: A | Discharge status: Home / Self Care | Source: Ambulatory Visit | Attending: Cardiology | Admitting: Cardiology

## 2024-06-29 ENCOUNTER — Other Ambulatory Visit (HOSPITAL_COMMUNITY): Payer: Self-pay

## 2024-06-29 ENCOUNTER — Telehealth (HOSPITAL_COMMUNITY): Payer: Self-pay

## 2024-06-29 ENCOUNTER — Encounter (HOSPITAL_COMMUNITY): Payer: Self-pay | Admitting: Cardiology

## 2024-06-29 ENCOUNTER — Ambulatory Visit (HOSPITAL_COMMUNITY): Payer: Self-pay | Admitting: Cardiology

## 2024-06-29 VITALS — BP 112/70 | HR 48 | Wt 158.0 lb

## 2024-06-29 DIAGNOSIS — I498 Other specified cardiac arrhythmias: Secondary | ICD-10-CM | POA: Diagnosis not present

## 2024-06-29 DIAGNOSIS — R001 Bradycardia, unspecified: Secondary | ICD-10-CM | POA: Diagnosis not present

## 2024-06-29 DIAGNOSIS — I10 Essential (primary) hypertension: Secondary | ICD-10-CM | POA: Diagnosis not present

## 2024-06-29 DIAGNOSIS — I071 Rheumatic tricuspid insufficiency: Secondary | ICD-10-CM

## 2024-06-29 DIAGNOSIS — I4821 Permanent atrial fibrillation: Secondary | ICD-10-CM | POA: Diagnosis not present

## 2024-06-29 DIAGNOSIS — Z7901 Long term (current) use of anticoagulants: Secondary | ICD-10-CM | POA: Diagnosis not present

## 2024-06-29 DIAGNOSIS — I110 Hypertensive heart disease with heart failure: Secondary | ICD-10-CM | POA: Diagnosis not present

## 2024-06-29 DIAGNOSIS — Z8249 Family history of ischemic heart disease and other diseases of the circulatory system: Secondary | ICD-10-CM | POA: Diagnosis not present

## 2024-06-29 DIAGNOSIS — I50810 Right heart failure, unspecified: Secondary | ICD-10-CM | POA: Insufficient documentation

## 2024-06-29 DIAGNOSIS — I4510 Unspecified right bundle-branch block: Secondary | ICD-10-CM | POA: Insufficient documentation

## 2024-06-29 DIAGNOSIS — Z79899 Other long term (current) drug therapy: Secondary | ICD-10-CM | POA: Insufficient documentation

## 2024-06-29 LAB — LIPID PANEL
Cholesterol: 89 mg/dL (ref 0–200)
HDL: 36 mg/dL — ABNORMAL LOW (ref >40)
LDL Cholesterol: 48 mg/dL (ref 0–99)
Total CHOL/HDL Ratio: 2.5 ratio
Triglycerides: 24 mg/dL (ref <150)
VLDL: 5 mg/dL (ref 0–40)

## 2024-06-29 LAB — BASIC METABOLIC PANEL WITH GFR
Anion gap: 11 (ref 5–15)
BUN: 28 mg/dL — ABNORMAL HIGH (ref 8–23)
CO2: 24 mmol/L (ref 22–32)
Calcium: 9 mg/dL (ref 8.9–10.3)
Chloride: 99 mmol/L (ref 98–111)
Creatinine, Ser: 1.34 mg/dL — ABNORMAL HIGH (ref 0.61–1.24)
GFR, Estimated: 53 mL/min — ABNORMAL LOW (ref >60)
Glucose, Bld: 95 mg/dL (ref 70–99)
Potassium: 4.1 mmol/L (ref 3.5–5.1)
Sodium: 134 mmol/L — ABNORMAL LOW (ref 135–145)

## 2024-06-29 LAB — BRAIN NATRIURETIC PEPTIDE: B Natriuretic Peptide: 1129.4 pg/mL — ABNORMAL HIGH (ref 0.0–100.0)

## 2024-06-29 MED ORDER — POTASSIUM CHLORIDE CRYS ER 20 MEQ PO TBCR: 20.0000 meq | EXTENDED_RELEASE_TABLET | Freq: Every day | ORAL | 3 refills | Status: AC

## 2024-06-29 MED ORDER — FARXIGA 10 MG PO TABS: 10.0000 mg | ORAL_TABLET | Freq: Every day | ORAL | 3 refills | Status: AC

## 2024-06-29 MED ORDER — AMLODIPINE BESYLATE 5 MG PO TABS: 5.0000 mg | ORAL_TABLET | Freq: Every day | ORAL | 3 refills | Status: AC

## 2024-06-29 MED ORDER — TORSEMIDE 20 MG PO TABS: 40.0000 mg | ORAL_TABLET | Freq: Every day | ORAL | 3 refills | Status: AC

## 2024-06-29 NOTE — Telephone Encounter (Signed)
 Advanced Heart Failure Patient Advocate Encounter  Test billing for this patient's current coverage (Rx Advance/ Health Team Advg) returns a $47 copay for 30 day supply, $117.50 for 90 days supply of Farxiga.  This test claim was processed through [facility]- copay amounts may vary at other pharmacies due to pharmacy/plan contracts, or as the patient moves through the different stages of their insurance plan.  Rachel DEL, CPhT Rx Patient Advocate Phone: [phone]

## 2024-06-29 NOTE — Progress Notes (Addendum)
 PCP: Regino Slater, MD Cardiology: Dr. Ladona HF Cardiology: Dr. Rolan  83 y.o. with history of permanent atrial fibrillation and RV failure/severe TR was referred by Dr. Dorn Casey from [facility] in [place] for CHF optimization prior to possible EVOQUE percutaneous tricuspid valve replacement.  Patient has had atrial fibrillation since 2019, he has failed DCCV and it is considered permanent. Patient noted gradual onset of exertional dyspnea beginning in the Spring of 2025.  He had echo in 5/25 showing EF 55-60%, D-shaped interventricular septum, moderate RV enlargement with normal RV systolic function, PASP 38 mmHg, mitral valve prolapse with mild MR, severe TR with dilated IVC. He was started on Coreg  6.25 mg bid and on Lasix  20 mg daily. TEE in 7/25 showed EF 55-60%, mild RV dysfunction with severe RV enlargement, PASP 33 mmHg, severe biatrial enlargement, mild-moderate MR, severe/torrential TR with EROA 1.12 cm^2, IVC dilated.  RHC/LHC was then done, showing 70% proximal LAD and 60% mid LAD stenoses; markedly elevated RA pressure with mildly elevated left-sided pressure, pulmonary venous hypertension, and low PAPi 1.2 but preserved cardiac index 2.56.  Patient was referred to [facility] for evaluation for percutaneous tricuspid valve repair.  He was thought not to be a tTEER candidate, but the EVOQUE valve was thought to be an option for him.  He was thought to need medical optimization before percutaneous tricuspid valve replacement.   Patient says that he has felt terrible since starting Coreg  with marked fatigue. HR is in upper 40s. He was tired walking into the office today.  He has not been able to go to the gym. He is short of breath with most moderate exertion.  He has fullness in his abdomen and swelling in his lower legs.  He is still taking Lasix  20 mg daily. No orthopnea/PND.  No lightheadedness. No chest pain. He brings home BP readings, SBP in 100s  generally.   ECG (personally reviewed): atrial fibrillation rate 48, nonspecific T wave changes.   Labs (6/25): K 4.7, creatinine 0.94  PMH: 1. HTN 2. Prostate cancer 3. Barrett's esophagus 4. Atrial fibrillation: Permanent since 2019. Failed DCCV in the past.  5. CAD: LHC (7/25) with 70% proximal LAD and 60% mid LAD stenoses.  6. RV failure with severe TR:  - Echo (5/25): EF 55-60%, D-shaped interventricular septum, moderate RV enlargement with normal RV systolic function, PASP 38 mmHg, mitral valve prolapse with mild MR, severe TR with dilated IVC.  - TEE (7/25): EF 55-60%, mild RV dysfunction with severe RV enlargement, PASP 33 mmHg, severe biatrial enlargement, mild-moderate MR, severe/torrential TR with EROA 1.12 cm^2, IVC dilated.  - RHC (7/25): mean RA 20 with large CV waves, PA 44/18, LVEDP 18, CI 2.56, PAPi 1.2.   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  Occasional ETOH, no smoking.   Family History  Problem Relation Age of Onset   Breast cancer Mother    Prostate cancer Maternal Grandfather    Heart disease Father    Colon cancer Neg Hx    ROS: All systems reviewed and negative except as per HPI.   Current Outpatient Medications  Medication Sig Dispense Refill   atorvastatin  (LIPITOR) 10 MG tablet Take 1 tablet (10 mg total) by mouth daily. 90 tablet 3   ELIQUIS  5 MG TABS tablet Take 1 tablet (5 mg total) by mouth 2 (two) times daily. 60 tablet 5   FARXIGA 10 MG TABS tablet Take 1 tablet (10 mg total) by  mouth daily before breakfast. 90 tablet 3   sertraline (ZOLOFT) 100 MG tablet Take 100 mg by mouth daily.     torsemide (DEMADEX) 20 MG tablet Take 2 tablets (40 mg total) by mouth daily. 180 tablet 3   valsartan -hydrochlorothiazide  (DIOVAN -HCT) 160-12.5 MG tablet TAKE 1 TABLET BY MOUTH EVERY MORNING 90 tablet 2   amLODipine  (NORVASC ) 5 MG tablet Take 1 tablet (5 mg total) by mouth daily. 90 tablet 3   potassium chloride  SA (KLOR-CON   M) 20 MEQ tablet Take 1 tablet (20 mEq total) by mouth daily. 90 tablet 3   No current facility-administered medications for this encounter.   BP 112/70   Pulse (!) 48   Wt 71.7 kg (158 lb)   SpO2 98%   BMI 24.02 kg/m  General: NAD Neck: JVP 14 cm with HJR, no thyromegaly or thyroid  nodule.  Lungs: Clear to auscultation bilaterally with normal respiratory effort. CV: Nondisplaced PMI.  Heart regular S1/S2, no S3/S4, 3/6 HSM LLSB/apex.  2+ edema 1/2 to knees.  No carotid bruit.  Normal pedal pulses.  Abdomen: Soft, nontender, no hepatosplenomegaly, no distention.  Skin: Intact without lesions or rashes.  Neurologic: Alert and oriented x 3.  Psych: Normal affect. Extremities: No clubbing or cyanosis.  HEENT: Normal.   Assessment/Plan: 1. HTN: BP is not elevated on his home readings, SBP in 100s.  He feels generally fatigued.  - I am going to decrease amlodipine  to 5 mg daily.  2. RV failure with severe TR: Tricuspid regurgitation is likely atrial and ventricular functional TR given dilated right atrium and dilated right ventricle.  TEE in 7/25 showed EF 55-60%, mild RV dysfunction with severe RV enlargement, PASP 33 mmHg, severe biatrial enlargement, mild-moderate MR, severe/torrential TR with EROA 1.12 cm^2, IVC dilated. RHC in 7/25 showed elevated R>L heart filling pressures, mild pulmonary venous hypertension, preserved CI 2.56 but low PAPi 1.2. He has felt much worse since starting Coreg , I suspect that this worsened his RV failure and also seems to be excessively lower his HR (in 40s).  He is significantly volume overloaded on exam today.  NYHA class III symptoms.  - Stop Coreg .  - Stop Lasix .  - Start torsemide 40 mg daily.  BMET/BNP today, BMET in 1 week.  - Start Farxiga 10 mg daily.  - Start KCl 20 daily.  - Wear graded compression stockings.  3. Tricuspid regurgitation: Torrential TR on 7/25 TEE.  Suspect atrial and ventricular functional TR with dilated RA and RV.  - He has  been seen at [facility] to assess for percutaneous TV repair.  He is not a candidate for tTEER but may be a candidate for the EVOQUE percutaneous tricuspid valve replacement.  He needs optimization of CHF prior to this.  4. Atrial fibrillation: Permanent.   - Continue Eliquis  5 mg bid.  - Stopping Coreg  as above, watch HR off Coreg .   Followup 2-3 weeks.   I spent 56 minutes reviewing records, interviewing/examining patient, and managing orders.   Ezra Shuck 06/29/2024

## 2024-06-29 NOTE — Patient Instructions (Signed)
 STOP Lasix .  STOP Carvedilol   CHANGE Potassium to 20 mEq ( 1 Tab) daily  DECREASE Amlodipine  to 5 mg daily.  START Torsemide 40 mg ( 2 Tab) daily.  START Farxiga 10 mg daily.  Labs done today, your results will be available in MyChart, we will contact you for abnormal readings.  REPEAT blood work in 1 week.  Your physician recommends that you schedule a follow-up appointment in: 3 weeks.  If you have any questions or concerns before your next appointment please send us  a message through [place] or call our office at [phone].    TO LEAVE A MESSAGE FOR THE NURSE SELECT OPTION 2, PLEASE LEAVE A MESSAGE INCLUDING: YOUR NAME DATE OF BIRTH CALL BACK NUMBER REASON FOR CALL**this is important as we prioritize the call backs  YOU WILL RECEIVE A CALL BACK THE SAME DAY AS LONG AS YOU CALL BEFORE 4:00 PM  At the [facility], you and your health needs are our priority. As part of our continuing mission to provide you with exceptional heart care, we have created designated Provider Care Teams. These Care Teams include your primary Cardiologist (physician) and Advanced Practice Providers (APPs- Physician Assistants and Nurse Practitioners) who all work together to provide you with the care you need, when you need it.   You may see any of the following providers on your designated Care Team at your next follow up: Dr Toribio Fuel Dr Ezra Shuck Dr. Ria Commander Dr. Morene Brownie Amy Lenetta, NP Caffie Shed, [place] [facility] [place], [place] Beckey Coe, NP [place] Lee, NP Ellouise Class, NP Tinnie Redman, PharmD Jaun Bash, PharmD   Please be sure to bring in all your medications bottles to every appointment.    Thank you for choosing [facility]

## 2024-07-06 ENCOUNTER — Ambulatory Visit (HOSPITAL_COMMUNITY)
Admission: RE | Admit: 2024-07-06 | Discharge: 2024-07-06 | Disposition: A | Discharge status: Home / Self Care | Source: Ambulatory Visit | Attending: Internal Medicine | Admitting: Internal Medicine

## 2024-07-06 DIAGNOSIS — I071 Rheumatic tricuspid insufficiency: Secondary | ICD-10-CM | POA: Insufficient documentation

## 2024-07-06 LAB — BASIC METABOLIC PANEL WITH GFR
Anion gap: 12 (ref 5–15)
BUN: 27 mg/dL — ABNORMAL HIGH (ref 8–23)
CO2: 27 mmol/L (ref 22–32)
Calcium: 9.1 mg/dL (ref 8.9–10.3)
Chloride: 92 mmol/L — ABNORMAL LOW (ref 98–111)
Creatinine, Ser: 1.29 mg/dL — ABNORMAL HIGH (ref 0.61–1.24)
GFR, Estimated: 55 mL/min — ABNORMAL LOW (ref >60)
Glucose, Bld: 65 mg/dL — ABNORMAL LOW (ref 70–99)
Potassium: 3.7 mmol/L (ref 3.5–5.1)
Sodium: 131 mmol/L — ABNORMAL LOW (ref 135–145)

## 2024-07-19 ENCOUNTER — Ambulatory Visit (HOSPITAL_COMMUNITY)
Admission: RE | Admit: 2024-07-19 | Discharge: 2024-07-19 | Disposition: A | Discharge status: Home / Self Care | Source: Ambulatory Visit | Attending: Cardiology | Admitting: Cardiology

## 2024-07-19 ENCOUNTER — Encounter (HOSPITAL_COMMUNITY): Payer: Self-pay | Admitting: Cardiology

## 2024-07-19 VITALS — BP 118/62 | HR 74 | Ht 68.0 in | Wt 143.1 lb

## 2024-07-19 DIAGNOSIS — I119 Hypertensive heart disease without heart failure: Secondary | ICD-10-CM | POA: Diagnosis not present

## 2024-07-19 DIAGNOSIS — R0609 Other forms of dyspnea: Secondary | ICD-10-CM | POA: Insufficient documentation

## 2024-07-19 DIAGNOSIS — I081 Rheumatic disorders of both mitral and tricuspid valves: Secondary | ICD-10-CM | POA: Diagnosis not present

## 2024-07-19 DIAGNOSIS — Z7901 Long term (current) use of anticoagulants: Secondary | ICD-10-CM | POA: Diagnosis not present

## 2024-07-19 DIAGNOSIS — I340 Nonrheumatic mitral (valve) insufficiency: Secondary | ICD-10-CM

## 2024-07-19 DIAGNOSIS — I4821 Permanent atrial fibrillation: Secondary | ICD-10-CM | POA: Insufficient documentation

## 2024-07-19 DIAGNOSIS — Z79899 Other long term (current) drug therapy: Secondary | ICD-10-CM | POA: Diagnosis not present

## 2024-07-19 DIAGNOSIS — Z7984 Long term (current) use of oral hypoglycemic drugs: Secondary | ICD-10-CM | POA: Diagnosis not present

## 2024-07-19 DIAGNOSIS — I2510 Atherosclerotic heart disease of native coronary artery without angina pectoris: Secondary | ICD-10-CM | POA: Insufficient documentation

## 2024-07-19 LAB — BRAIN NATRIURETIC PEPTIDE: B Natriuretic Peptide: 363.8 pg/mL — ABNORMAL HIGH (ref 0.0–100.0)

## 2024-07-19 LAB — BASIC METABOLIC PANEL WITH GFR
Anion gap: 15 (ref 5–15)
BUN: 36 mg/dL — ABNORMAL HIGH (ref 8–23)
CO2: 24 mmol/L (ref 22–32)
Calcium: 8.9 mg/dL (ref 8.9–10.3)
Chloride: 90 mmol/L — ABNORMAL LOW (ref 98–111)
Creatinine, Ser: 1.06 mg/dL (ref 0.61–1.24)
GFR, Estimated: >60 mL/min (ref >60)
Glucose, Bld: 117 mg/dL — ABNORMAL HIGH (ref 70–99)
Potassium: 4 mmol/L (ref 3.5–5.1)
Sodium: 129 mmol/L — ABNORMAL LOW (ref 135–145)

## 2024-07-19 NOTE — Patient Instructions (Signed)
 There has been no changes to your medications.  Labs done today, your results will be available in MyChart, we will contact you for abnormal readings.  Your physician recommends that you schedule a follow-up appointment in: 6 weeks.  If you have any questions or concerns before your next appointment please send us  a message through [place] or call our office at [phone].    TO LEAVE A MESSAGE FOR THE NURSE SELECT OPTION 2, PLEASE LEAVE A MESSAGE INCLUDING: YOUR NAME DATE OF BIRTH CALL BACK NUMBER REASON FOR CALL**this is important as we prioritize the call backs  YOU WILL RECEIVE A CALL BACK THE SAME DAY AS LONG AS YOU CALL BEFORE 4:00 PM  At the [facility], you and your health needs are our priority. As part of our continuing mission to provide you with exceptional heart care, we have created designated Provider Care Teams. These Care Teams include your primary Cardiologist (physician) and Advanced Practice Providers (APPs- Physician Assistants and Nurse Practitioners) who all work together to provide you with the care you need, when you need it.   You may see any of the following providers on your designated Care Team at your next follow up: Dr Toribio Fuel Dr Ezra Shuck Dr. Morene Brownie Greig Mosses, NP Caffie Shed, [place] [facility] [place], [place] Beckey Coe, NP [place] Lee, NP Ellouise Class, NP Tinnie Redman, PharmD Jaun Bash, PharmD   Please be sure to bring in all your medications bottles to every appointment.    Thank you for choosing [facility]

## 2024-07-19 NOTE — Progress Notes (Signed)
 PCP: Regino Slater, MD Cardiology: Dr. Ladona HF Cardiology: Dr. Rolan  83 y.o. with history of permanent atrial fibrillation and RV failure/severe TR was referred by Dr. Dorn Casey from [facility] in [place] for CHF optimization prior to possible EVOQUE percutaneous tricuspid valve replacement.  Patient has had atrial fibrillation since 2019, he has failed DCCV and it is considered permanent. Patient noted gradual onset of exertional dyspnea beginning in the Spring of 2025.  He had echo in 5/25 showing EF 55-60%, D-shaped interventricular septum, moderate RV enlargement with normal RV systolic function, PASP 38 mmHg, mitral valve prolapse with mild MR, severe TR with dilated IVC. He was started on Coreg  6.25 mg bid and on Lasix  20 mg daily. TEE in 7/25 showed EF 55-60%, mild RV dysfunction with severe RV enlargement, PASP 33 mmHg, severe biatrial enlargement, mild-moderate MR, severe/torrential TR with EROA 1.12 cm^2, IVC dilated.  RHC/LHC was then done, showing 70% proximal LAD and 60% mid LAD stenoses; markedly elevated RA pressure with mildly elevated left-sided pressure, pulmonary venous hypertension, and low PAPi 1.2 but preserved cardiac index 2.56.  Patient was referred to [facility] for evaluation for percutaneous tricuspid valve repair.  He was thought not to be a tTEER candidate, but the EVOQUE valve was thought to be an option for him.  He was thought to need medical optimization before percutaneous tricuspid valve replacement.   At initial appointment, patient said that he felt terrible since starting Coreg  with marked fatigue. HR was in upper 40s. He was profoundly fatigued and dyspneic and was volume overloaded on exam.  I had him stop Coreg  and transition from Lasix  to torsemide + Farxiga.   Patient returns for followup of CHF/RV failure.  He is doing much better today.  Weight is down 15 lbs.  He started going to the gym again.  No dyspnea now with usual  activities.  He rides an exercise bike and can walk 3/4 mile on the treadmill in 14 minutes.  No lightheadedness.  No chest pain.  No orthopnea/PND.  BP is controlled.  HR in 60s at rest off Coreg .   ECG (personally reviewed): atrial flutter rate 68, nonspecific T wave changes.   Labs (6/25): K 4.7, creatinine 0.94 Labs (10/25): K 3.7, creatinine 1.29, LDL 48, BNP 1129  PMH: 1. HTN 2. Prostate cancer 3. Barrett's esophagus 4. Atrial fibrillation: Permanent since 2019. Failed DCCV in the past.  5. CAD: LHC (7/25) with 70% proximal LAD and 60% mid LAD stenoses.  6. RV failure with severe TR:  - Echo (5/25): EF 55-60%, D-shaped interventricular septum, moderate RV enlargement with normal RV systolic function, PASP 38 mmHg, mitral valve prolapse with mild MR, severe TR with dilated IVC.  - TEE (7/25): EF 55-60%, mild RV dysfunction with severe RV enlargement, PASP 33 mmHg, severe biatrial enlargement, mild-moderate MR, severe/torrential TR with EROA 1.12 cm^2, IVC dilated.  - RHC (7/25): mean RA 20 with large CV waves, PA 44/18, LVEDP 18, CI 2.56, PAPi 1.2.   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  Occasional ETOH, no smoking.   Family History  Problem Relation Age of Onset   Breast cancer Mother    Prostate cancer Maternal Grandfather    Heart disease Father    Colon cancer Neg Hx    ROS: All systems reviewed and negative except as per HPI.   Current Outpatient Medications  Medication Sig Dispense Refill   amLODipine  (NORVASC ) 5 MG tablet Take  1 tablet (5 mg total) by mouth daily. 90 tablet 3   atorvastatin  (LIPITOR) 10 MG tablet Take 1 tablet (10 mg total) by mouth daily. 90 tablet 3   ELIQUIS  5 MG TABS tablet Take 1 tablet (5 mg total) by mouth 2 (two) times daily. 60 tablet 5   FARXIGA 10 MG TABS tablet Take 1 tablet (10 mg total) by mouth daily before breakfast. 90 tablet 3   potassium chloride  SA (KLOR-CON  M) 20 MEQ tablet Take 1 tablet (20  mEq total) by mouth daily. 90 tablet 3   sertraline (ZOLOFT) 100 MG tablet Take 100 mg by mouth daily.     torsemide (DEMADEX) 20 MG tablet Take 2 tablets (40 mg total) by mouth daily. 180 tablet 3   valsartan -hydrochlorothiazide  (DIOVAN -HCT) 160-12.5 MG tablet TAKE 1 TABLET BY MOUTH EVERY MORNING 90 tablet 2   No current facility-administered medications for this encounter.   BP 118/62   Pulse 74   Ht 5' 8 (1.727 m)   Wt 64.9 kg (143 lb 2.2 oz)   SpO2 98%   BMI 21.76 kg/m  General: NAD Neck: No JVD, no thyromegaly or thyroid  nodule.  Lungs: Clear to auscultation bilaterally with normal respiratory effort. CV: Nondisplaced PMI.  Heart regular S1/S2, no S3/S4, 2/6 HSM LLSB.  No peripheral edema.  No carotid bruit.  Normal pedal pulses.  Abdomen: Soft, nontender, no hepatosplenomegaly, no distention.  Skin: Intact without lesions or rashes.  Neurologic: Alert and oriented x 3.  Psych: Normal affect. Extremities: No clubbing or cyanosis.  HEENT: Normal.   Assessment/Plan: 1. HTN: BP controlled on current regimen.  2. RV failure with severe TR: Tricuspid regurgitation is likely atrial and ventricular functional TR given dilated right atrium and dilated right ventricle.  TEE in 7/25 showed EF 55-60%, mild RV dysfunction with severe RV enlargement, PASP 33 mmHg, severe biatrial enlargement, mild-moderate MR, severe/torrential TR with EROA 1.12 cm^2, IVC dilated. RHC in 7/25 showed elevated R>L heart filling pressures, mild pulmonary venous hypertension, preserved CI 2.56 but low PAPi 1.2.  Feeling much better today, weight down 15 lbs, HR in 60s.  He is not volume overloaded on exam.  NYHA class I-II.   - He should avoid beta blockers in future with RV failure and history of bradycardia.   - Continue torsemide 40 mg daily, BMET/BNP today.   - Continue Farxiga 10 mg daily.  3. Tricuspid regurgitation: Torrential TR on 7/25 TEE.  Suspect atrial and ventricular functional TR with dilated RA  and RV.  He has been seen at [facility] to assess for percutaneous TV repair.  He is not a candidate for tTEER but may be a candidate for the EVOQUE percutaneous tricuspid valve replacement.  I think he is optimized now for percutaneous valve replacement.  - I think he can go back to [facility] at this point for ongoing valve workup.  We will try to contact Dr. Arlana.  4. Atrial fibrillation: Permanent. Reasonable rate control off Coreg .  - Continue Eliquis  5 mg bid.   Followup 6 wks with APP.   I spent 31 minutes reviewing records, interviewing/examining patient, and managing orders.   Ezra Shuck 07/19/2024

## 2024-07-20 ENCOUNTER — Ambulatory Visit (HOSPITAL_COMMUNITY): Payer: Self-pay | Admitting: Cardiology

## 2024-07-22 ENCOUNTER — Telehealth (HOSPITAL_COMMUNITY): Payer: Self-pay | Admitting: *Deleted

## 2024-07-22 NOTE — Telephone Encounter (Signed)
 Called patient per Dr. Rolan with following lab results and instructions:  BNP significantly lower. Na also low, would have him cut back on po fluid intake with low Na.  Pt verbalized understanding and agreement to same.

## 2024-08-17 DIAGNOSIS — I071 Rheumatic tricuspid insufficiency: Secondary | ICD-10-CM | POA: Diagnosis not present

## 2024-08-17 DIAGNOSIS — I5032 Chronic diastolic (congestive) heart failure: Secondary | ICD-10-CM | POA: Diagnosis not present

## 2024-08-23 ENCOUNTER — Ambulatory Visit: Admitting: Cardiology

## 2024-08-27 ENCOUNTER — Telehealth (HOSPITAL_COMMUNITY): Payer: Self-pay

## 2024-08-27 NOTE — Telephone Encounter (Signed)
 Called to confirm/remind patient of their appointment at the [facility] on 08/30/24.   Appointment:   [x] Confirmed  [] Left mess   [] No answer/No voice mail  [] VM Full/unable to leave message  [] Phone not in service  Patient reminded to bring all medications and/or complete list.  Confirmed patient has transportation. Gave directions, instructed to utilize valet parking.

## 2024-08-27 NOTE — Progress Notes (Signed)
 PCP: Regino Slater, MD Cardiology: Dr. Ladona HF Cardiology: Dr. Rolan  83 y.o. with history of permanent atrial fibrillation and RV failure/severe TR was referred by Dr. Dorn Casey from [facility] in [place] for CHF optimization prior to possible EVOQUE percutaneous tricuspid valve replacement.  Patient has had atrial fibrillation since 2019, he has failed DCCV and it is considered permanent. Patient noted gradual onset of exertional dyspnea beginning in the Spring of 2025.  He had echo in 5/25 showing EF 55-60%, D-shaped interventricular septum, moderate RV enlargement with normal RV systolic function, PASP 38 mmHg, mitral valve prolapse with mild MR, severe TR with dilated IVC. He was started on Coreg  6.25 mg bid and on Lasix  20 mg daily. TEE in 7/25 showed EF 55-60%, mild RV dysfunction with severe RV enlargement, PASP 33 mmHg, severe biatrial enlargement, mild-moderate MR, severe/torrential TR with EROA 1.12 cm^2, IVC dilated.  RHC/LHC was then done, showing 70% proximal LAD and 60% mid LAD stenoses; markedly elevated RA pressure with mildly elevated left-sided pressure, pulmonary venous hypertension, and low PAPi 1.2 but preserved cardiac index 2.56.  Patient was referred to [facility] for evaluation for percutaneous tricuspid valve repair.  He was thought not to be a tTEER candidate, but the EVOQUE valve was thought to be an option for him.  He was thought to need medical optimization before percutaneous tricuspid valve replacement.   At initial appointment, patient said that he felt terrible since starting Coreg  with marked fatigue. HR was in upper 40s. He was profoundly fatigued and dyspneic and was volume overloaded on exam.  We had him stop Coreg  and transition from Lasix  to torsemide  + Farxiga .   Today he returns for HF follow up with his wife. Overall feeling fine. No SOB, goes to the gym frequently. Has occasional positional dizziness, no falls or syncope.  Denies palpitations, abnormal bleeding, CP, dizziness, edema, or PND/Orthopnea. Appetite ok. Weight at home 140's pounds. Taking all medications. Goes to [facility] tomorrow for CT and TEE.   ECG (personally reviewed): none ordered today.  Labs (6/25): K 4.7, creatinine 0.94 Labs (10/25): K 4.0, creatinine 1.06, LDL 48, BNP 1129  PMH: 1. HTN 2. Prostate cancer 3. Barrett's esophagus 4. Atrial fibrillation: Permanent since 2019. Failed DCCV in the past.  5. CAD: LHC (7/25) with 70% proximal LAD and 60% mid LAD stenoses.  6. RV failure with severe TR:  - Echo (5/25): EF 55-60%, D-shaped interventricular septum, moderate RV enlargement with normal RV systolic function, PASP 38 mmHg, mitral valve prolapse with mild MR, severe TR with dilated IVC.  - TEE (7/25): EF 55-60%, mild RV dysfunction with severe RV enlargement, PASP 33 mmHg, severe biatrial enlargement, mild-moderate MR, severe/torrential TR with EROA 1.12 cm^2, IVC dilated.  - RHC (7/25): mean RA 20 with large CV waves, PA 44/18, LVEDP 18, CI 2.56, PAPi 1.2.   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  Occasional ETOH, no smoking.   Family History  Problem Relation Age of Onset   Breast cancer Mother    Prostate cancer Maternal Grandfather    Heart disease Father    Colon cancer Neg Hx    ROS: All systems reviewed and negative except as per HPI.   Current Outpatient Medications  Medication Sig Dispense Refill   amLODipine  (NORVASC ) 5 MG tablet Take 1 tablet (5 mg total) by mouth daily. 90 tablet 3   atorvastatin  (LIPITOR) 10 MG tablet Take 1 tablet (10 mg total) by  mouth daily. 90 tablet 3   ELIQUIS  5 MG TABS tablet Take 1 tablet (5 mg total) by mouth 2 (two) times daily. 60 tablet 5   FARXIGA  10 MG TABS tablet Take 1 tablet (10 mg total) by mouth daily before breakfast. 90 tablet 3   potassium chloride  SA (KLOR-CON  M) 20 MEQ tablet Take 1 tablet (20 mEq total) by mouth daily. 90 tablet 3    sertraline (ZOLOFT) 100 MG tablet Take 100 mg by mouth daily.     torsemide  (DEMADEX ) 20 MG tablet Take 2 tablets (40 mg total) by mouth daily. 180 tablet 3   valsartan -hydrochlorothiazide  (DIOVAN -HCT) 160-12.5 MG tablet TAKE 1 TABLET BY MOUTH EVERY MORNING 90 tablet 2   No current facility-administered medications for this encounter.   Wt Readings from Last 3 Encounters:  08/30/24 65.9 kg (145 lb 3.2 oz)  07/19/24 64.9 kg (143 lb 2.2 oz)  06/29/24 71.7 kg (158 lb)   BP 120/68   Pulse 66   Wt 65.9 kg (145 lb 3.2 oz)   SpO2 98%   BMI 22.08 kg/m   Physical Exam General:  NAD. No resp difficulty, walked into the clinic, appears younger than stated age. HEENT: Normal Neck: Supple. No JVD. Cor: Irregular rate & rhythm. No rubs, gallops, 2/6 TR Lungs: Clear Abdomen: Soft, nontender, nondistended.  Extremities: No cyanosis, clubbing, rash, edema Neuro: Alert & oriented x 3, moves all 4 extremities w/o difficulty. Affect pleasant.  Assessment/Plan: 1. HTN: BP controlled on current regimen. I asked him to notify clinic if orthostatic symptoms worsen. - Continue Diovan -HCT.  - Continue amlodipine  5 mg daily. 2. RV failure with severe TR: Tricuspid regurgitation is likely atrial and ventricular functional TR given dilated right atrium and dilated right ventricle.  TEE in 7/25 showed EF 55-60%, mild RV dysfunction with severe RV enlargement, PASP 33 mmHg, severe biatrial enlargement, mild-moderate MR, severe/torrential TR with EROA 1.12 cm^2, IVC dilated. RHC in 7/25 showed elevated R>L heart filling pressures, mild pulmonary venous hypertension, preserved CI 2.56 but low PAPi 1.2.  NYHA class I-II.  He is not volume overloaded on exam. - He should avoid beta blockers in future with RV failure and history of bradycardia.   - Continue torsemide  40 mg daily + 20 KCL daily, BMET/BNP today.   - Continue Farxiga  10 mg daily. No GU symptoms. 3. Tricuspid regurgitation: Torrential TR on 7/25 TEE.   Suspect atrial and ventricular functional TR with dilated RA and RV.  He has been seen at [facility] to assess for percutaneous TV repair.  He is not a candidate for tTEER but may be a candidate for the EVOQUE percutaneous tricuspid valve replacement.  He is optimized now for percutaneous valve replacement.  - He has been referred to [facility] for ongoing valve workup.  He has follow up tomorrow. 4. Atrial fibrillation: Permanent. Reasonable rate control off Coreg .  - Continue Eliquis  5 mg bid. No bleeding issues. CBC today.  Follow up in 3-4 months with Dr. Rolan Raisin [facility] FNP-BC 08/30/2024

## 2024-08-30 ENCOUNTER — Ambulatory Visit (HOSPITAL_COMMUNITY)
Admission: RE | Admit: 2024-08-30 | Discharge: 2024-08-30 | Disposition: A | Source: Ambulatory Visit | Attending: Family Medicine

## 2024-08-30 ENCOUNTER — Ambulatory Visit (HOSPITAL_COMMUNITY): Payer: Self-pay | Admitting: Family Medicine

## 2024-08-30 ENCOUNTER — Encounter (HOSPITAL_COMMUNITY): Payer: Self-pay

## 2024-08-30 VITALS — BP 120/68 | HR 66 | Wt 145.2 lb

## 2024-08-30 DIAGNOSIS — I4821 Permanent atrial fibrillation: Secondary | ICD-10-CM

## 2024-08-30 DIAGNOSIS — Z79899 Other long term (current) drug therapy: Secondary | ICD-10-CM | POA: Diagnosis not present

## 2024-08-30 DIAGNOSIS — I340 Nonrheumatic mitral (valve) insufficiency: Secondary | ICD-10-CM

## 2024-08-30 DIAGNOSIS — I10 Essential (primary) hypertension: Secondary | ICD-10-CM | POA: Diagnosis not present

## 2024-08-30 DIAGNOSIS — I110 Hypertensive heart disease with heart failure: Secondary | ICD-10-CM | POA: Diagnosis not present

## 2024-08-30 DIAGNOSIS — I50810 Right heart failure, unspecified: Secondary | ICD-10-CM | POA: Insufficient documentation

## 2024-08-30 DIAGNOSIS — Z7901 Long term (current) use of anticoagulants: Secondary | ICD-10-CM | POA: Diagnosis not present

## 2024-08-30 DIAGNOSIS — I361 Nonrheumatic tricuspid (valve) insufficiency: Secondary | ICD-10-CM | POA: Diagnosis not present

## 2024-08-30 DIAGNOSIS — Z7984 Long term (current) use of oral hypoglycemic drugs: Secondary | ICD-10-CM | POA: Diagnosis not present

## 2024-08-30 LAB — CBC
HCT: 40.1 % (ref 39.0–52.0)
Hemoglobin: 14 g/dL (ref 13.0–17.0)
MCH: 33.7 pg (ref 26.0–34.0)
MCHC: 34.9 g/dL (ref 30.0–36.0)
MCV: 96.6 fL (ref 80.0–100.0)
Platelets: 225 K/uL (ref 150–400)
RBC: 4.15 MIL/uL — ABNORMAL LOW (ref 4.22–5.81)
RDW: 13.6 % (ref 11.5–15.5)
WBC: 6.8 K/uL (ref 4.0–10.5)
nRBC: 0 % (ref 0.0–0.2)

## 2024-08-30 LAB — BASIC METABOLIC PANEL WITH GFR
Anion gap: 10 (ref 5–15)
BUN: 29 mg/dL — ABNORMAL HIGH (ref 8–23)
CO2: 29 mmol/L (ref 22–32)
Calcium: 8.9 mg/dL (ref 8.9–10.3)
Chloride: 96 mmol/L — ABNORMAL LOW (ref 98–111)
Creatinine, Ser: 1.23 mg/dL (ref 0.61–1.24)
GFR, Estimated: 58 mL/min — ABNORMAL LOW (ref >60)
Glucose, Bld: 106 mg/dL — ABNORMAL HIGH (ref 70–99)
Potassium: 3.6 mmol/L (ref 3.5–5.1)
Sodium: 135 mmol/L (ref 135–145)

## 2024-08-30 LAB — BRAIN NATRIURETIC PEPTIDE: B Natriuretic Peptide: 306.4 pg/mL — ABNORMAL HIGH (ref 0.0–100.0)

## 2024-08-30 NOTE — Patient Instructions (Addendum)
 Good to see you today!  Labs done today, your results will be available in MyChart, we will contact you for abnormal readings.  Your physician recommends that you schedule a follow-up appointment 3 months (March) Call office in January to schedule an appointment  If you have any questions or concerns before your next appointment please send us  a message through [facility] or call our office at [phone].    TO LEAVE A MESSAGE FOR THE NURSE SELECT OPTION 2, PLEASE LEAVE A MESSAGE INCLUDING: YOUR NAME DATE OF BIRTH CALL BACK NUMBER REASON FOR CALL**this is important as we prioritize the call backs  YOU WILL RECEIVE A CALL BACK THE SAME DAY AS LONG AS YOU CALL BEFORE 4:00 PM At the [facility], you and your health needs are our priority. As part of our continuing mission to provide you with exceptional heart care, we have created designated Provider Care Teams. These Care Teams include your primary Cardiologist (physician) and Advanced Practice Providers (APPs- Physician Assistants and Nurse Practitioners) who all work together to provide you with the care you need, when you need it.   You may see any of the following providers on your designated Care Team at your next follow up: Dr Toribio Fuel Dr Ezra Shuck Dr. Morene Brownie Greig Mosses, NP Caffie Shed, [place] [facility] [place], [place] Beckey Coe, NP Jordan Lee, NP Ellouise Class, NP Tinnie Redman, PharmD Jaun Bash, PharmD   Please be sure to bring in all your medications bottles to every appointment.    Thank you for choosing [facility]

## 2024-08-31 DIAGNOSIS — I081 Rheumatic disorders of both mitral and tricuspid valves: Secondary | ICD-10-CM | POA: Diagnosis not present

## 2024-08-31 DIAGNOSIS — I5032 Chronic diastolic (congestive) heart failure: Secondary | ICD-10-CM | POA: Diagnosis not present

## 2024-08-31 DIAGNOSIS — I517 Cardiomegaly: Secondary | ICD-10-CM | POA: Diagnosis not present

## 2024-08-31 DIAGNOSIS — I071 Rheumatic tricuspid insufficiency: Secondary | ICD-10-CM | POA: Diagnosis not present

## 2024-08-31 DIAGNOSIS — I272 Pulmonary hypertension, unspecified: Secondary | ICD-10-CM | POA: Diagnosis not present

## 2024-10-18 ENCOUNTER — Encounter (HOSPITAL_COMMUNITY): Payer: Self-pay | Admitting: Cardiology

## 2024-10-22 NOTE — Telephone Encounter (Signed)
@   Dr Rolan Records are available per patient

## 2024-11-18 ENCOUNTER — Ambulatory Visit (HOSPITAL_COMMUNITY): Admitting: Cardiology
